# Patient Record
Sex: Female | Born: 1990 | Race: Black or African American | Hispanic: No | Marital: Single | Smoking: Never smoker
Health system: Southern US, Community
[De-identification: ages and names within clinical notes are randomized; demographics above are authoritative.]

## PROBLEM LIST (undated history)

## (undated) DIAGNOSIS — D1030 Benign neoplasm of unspecified part of mouth: Secondary | ICD-10-CM

## (undated) HISTORY — DX: Benign neoplasm of unspecified part of mouth: D10.30

---
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
## 2015-10-03 ENCOUNTER — Encounter: Payer: Self-pay | Admitting: Family Medicine

## 2015-10-03 ENCOUNTER — Ambulatory Visit (INDEPENDENT_AMBULATORY_CARE_PROVIDER_SITE_OTHER): Payer: 59 | Admitting: Family Medicine

## 2015-10-03 VITALS — BP 98/74 | HR 99 | Temp 98.8°F | Resp 15 | Ht 64.0 in | Wt 112.0 lb

## 2015-10-03 DIAGNOSIS — Z111 Encounter for screening for respiratory tuberculosis: Secondary | ICD-10-CM

## 2015-10-03 DIAGNOSIS — Z23 Encounter for immunization: Secondary | ICD-10-CM

## 2015-10-03 NOTE — Progress Notes (Signed)
Name: Shelly Hansen   MRN: [id number]    DOB: 27-Jan-1991   Date:10/03/2015       Progress Note  Subjective  Chief Complaint  Chief Complaint  Patient presents with  . Establish Care    NP    HPI  Pt. Is here to establish care. She has no previous PCP. Last Physical over 10 years ago. She is here requesting a TB test, required for her new job.  Past Medical History  Diagnosis Date  . Benign tumor of oral mucosa     Surgery for excision in 2003, 2009, and 2012    Past Surgical History  Procedure Laterality Date  . Mandible reconstruction Right 2012    Family History  Problem Relation Age of Onset  . Diabetes Father   . Hypertension Mother   . Healthy Brother     Social History   Social History  . Marital Status: Single    Spouse Name: N/A  . Number of Children: N/A  . Years of Education: N/A   Occupational History  . Not on file.   Social History Main Topics  . Smoking status: Never Smoker   . Smokeless tobacco: Never Used  . Alcohol Use: No  . Drug Use: No  . Sexual Activity: No   Other Topics Concern  . Not on file   Social History Narrative  . No narrative on file    No current outpatient prescriptions on file.  No Known Allergies   Review of Systems  Constitutional: Negative for fever, chills and weight loss.  Respiratory: Negative for cough, hemoptysis, shortness of breath and wheezing.   Cardiovascular: Negative for chest pain.    Objective  Filed Vitals:   10/03/15 1217  BP: 98/74  Pulse: 99  Temp: 98.8 F (37.1 C)  TempSrc: Oral  Resp: 15  Height: 5\' 4"  (1.626 m)  Weight: 112 lb (50.803 kg)  SpO2: 97%    Physical Exam  Constitutional: She is well-developed, well-nourished, and in no distress.  Cardiovascular: Normal rate, regular rhythm and normal heart sounds.   Pulmonary/Chest: Effort normal and breath sounds normal.  Nursing note and vitals reviewed.   Assessment & Plan  1. Need for immunization against  influenza  - Flu Vaccine QUAD 36+ mos PF IM (Fluarix & Fluzone Quad PF)  2. Encounter for PPD test  - PPD   Mohsen Odenthal Asad A. [facility] 10/03/2015 12:51 PM

## 2015-10-05 LAB — TB SKIN TEST
Induration: 0.2 mm
TB Skin Test: NEGATIVE

## 2015-10-23 ENCOUNTER — Ambulatory Visit: Payer: 59 | Admitting: Family Medicine

## 2015-11-02 ENCOUNTER — Encounter: Payer: 59 | Admitting: Family Medicine

## 2015-12-14 ENCOUNTER — Ambulatory Visit (INDEPENDENT_AMBULATORY_CARE_PROVIDER_SITE_OTHER): Payer: 59 | Admitting: Family Medicine

## 2015-12-14 ENCOUNTER — Encounter: Payer: Self-pay | Admitting: Family Medicine

## 2015-12-14 VITALS — BP 100/71 | HR 110 | Temp 98.8°F | Resp 19 | Ht 64.0 in | Wt 112.2 lb

## 2015-12-14 DIAGNOSIS — J309 Allergic rhinitis, unspecified: Secondary | ICD-10-CM | POA: Insufficient documentation

## 2015-12-14 DIAGNOSIS — J302 Other seasonal allergic rhinitis: Secondary | ICD-10-CM

## 2015-12-14 DIAGNOSIS — Z0000 Encounter for general adult medical examination without abnormal findings: Secondary | ICD-10-CM | POA: Diagnosis not present

## 2015-12-14 MED ORDER — FLUTICASONE PROPIONATE 50 MCG/ACT NA SUSP: 2.0000 | Freq: Every day | NASAL | Status: DC

## 2015-12-14 NOTE — Progress Notes (Signed)
Name: Shelly Hansen   MRN: [id number]    DOB: [birth year]   Date:12/14/2015       Progress Note  Subjective  Chief Complaint  Chief Complaint  Patient presents with  . Annual Exam    CPE w/pap    HPI  Pt. Is here for a Complete Physical Exam. She has never had a Pap Smear.    Past Medical History  Diagnosis Date  . Benign tumor of oral mucosa     Surgery for excision in 2003, 2009, and 2012    Past Surgical History  Procedure Laterality Date  . Mandible reconstruction Right 2012    Family History  Problem Relation Age of Onset  . Diabetes Father   . Hypertension Mother   . Healthy Brother     Social History   Social History  . Marital Status: Single    Spouse Name: N/A  . Number of Children: N/A  . Years of Education: N/A   Occupational History  . Not on file.   Social History Main Topics  . Smoking status: Never Smoker   . Smokeless tobacco: Never Used  . Alcohol Use: No  . Drug Use: No  . Sexual Activity: No   Other Topics Concern  . Not on file   Social History Narrative    No current outpatient prescriptions on file.  No Known Allergies   Review of Systems  Constitutional: Negative for chills, weight loss and malaise/fatigue. Fever: Had fever with stomach flu 2 weeks ago.  HENT: Negative for congestion and sore throat.   Eyes: Negative for blurred vision and double vision.  Respiratory: Negative for cough and shortness of breath.   Cardiovascular: Negative for chest pain.  Gastrointestinal: Negative for nausea, vomiting, abdominal pain, diarrhea, constipation and blood in stool.  Genitourinary: Negative for dysuria, frequency and hematuria.  Musculoskeletal: Negative for back pain and joint pain.  Skin: Negative for rash.  Neurological: Negative for dizziness and headaches.  Psychiatric/Behavioral: Negative for depression. The patient is not nervous/anxious and does not have insomnia.     Objective  Filed Vitals:   12/14/15 0900  BP:  100/71  Pulse: 110  Temp: 98.8 F (37.1 C)  TempSrc: Oral  Resp: 19  Height: 5\' 4"  (1.626 m)  Weight: 112 lb 3.2 oz (50.894 kg)  SpO2: 98%    Physical Exam  Constitutional: She is oriented to person, place, and time and well-developed, well-nourished, and in no distress.  HENT:  Head: Normocephalic and atraumatic.  Right Ear: Tympanic membrane and ear canal normal.  Left Ear: Tympanic membrane and ear canal normal.  Mouth/Throat: Mucous membranes are normal. No posterior oropharyngeal erythema.  Nasal turbinate hypertrophy, erythematous nasal mucosa.  Eyes: Conjunctivae are normal. Pupils are equal, round, and reactive to light.  Neck: Normal range of motion. Neck supple. No thyromegaly present.  Cardiovascular: Normal rate and regular rhythm.   Pulmonary/Chest: Effort normal and breath sounds normal.  Abdominal: Soft. Bowel sounds are normal.  Genitourinary:  Deferred  Musculoskeletal: Normal range of motion. She exhibits no edema.  Neurological: She is alert and oriented to person, place, and time.  Skin: Skin is warm and dry.  Psychiatric: Mood, memory, affect and judgment normal.  Nursing note and vitals reviewed.     Assessment & Plan  1. Well woman exam without gynecological exam [facility]'ll [facility] for [facility]ological exam. - CBC with Differential - Comprehensive Metabolic Panel (CMET) - TSH - Vitamin D (25 hydroxy) - Ambulatory referral to  Gynecology - Lipid Profile  2. Other seasonal allergic rhinitis Started on Flonase for relief of allergic rhinitis. - fluticasone (FLONASE) 50 MCG/ACT nasal spray; Place 2 sprays into both nostrils daily.  Dispense: 16 g; Refill: 0   Shelly Hansen Asad A. [facility] 12/14/2015 9:23 AM

## 2016-01-28 ENCOUNTER — Ambulatory Visit: Payer: 59 | Admitting: Family Medicine

## 2016-02-21 ENCOUNTER — Encounter: Payer: 59 | Admitting: Obstetrics and Gynecology

## 2017-12-30 ENCOUNTER — Encounter: Payer: Self-pay | Admitting: Family Medicine

## 2017-12-30 ENCOUNTER — Ambulatory Visit: Payer: 59 | Admitting: Family Medicine

## 2017-12-30 VITALS — BP 122/70 | HR 85 | Temp 98.8°F | Resp 20 | Ht 64.0 in | Wt 129.6 lb

## 2017-12-30 DIAGNOSIS — R0981 Nasal congestion: Secondary | ICD-10-CM | POA: Diagnosis not present

## 2017-12-30 DIAGNOSIS — J209 Acute bronchitis, unspecified: Secondary | ICD-10-CM | POA: Diagnosis not present

## 2017-12-30 MED ORDER — PROMETHAZINE-DM 6.25-15 MG/5ML PO SYRP: 5.0000 mL | ORAL_SOLUTION | Freq: Every evening | ORAL | 0 refills | Status: DC | PRN

## 2017-12-30 MED ORDER — BUDESONIDE-FORMOTEROL FUMARATE 80-4.5 MCG/ACT IN AERO: 2.0000 | INHALATION_SPRAY | Freq: Two times a day (BID) | RESPIRATORY_TRACT | 3 refills | Status: DC

## 2017-12-30 MED ORDER — AZITHROMYCIN 250 MG PO TABS: ORAL_TABLET | ORAL | 0 refills | Status: DC

## 2017-12-30 MED ORDER — FLUTICASONE PROPIONATE 50 MCG/ACT NA SUSP: 2.0000 | Freq: Every day | NASAL | 6 refills | Status: AC

## 2017-12-30 MED ORDER — BENZONATATE 100 MG PO CAPS: 100.0000 mg | ORAL_CAPSULE | Freq: Two times a day (BID) | ORAL | 0 refills | Status: DC | PRN

## 2017-12-30 NOTE — Progress Notes (Signed)
Name: Shelly Hansen   MRN: [id number]    DOB: Jun 06, 1991   Date:12/30/2017       Progress Note  Subjective  Chief Complaint  Chief Complaint  Patient presents with  . Cough    for 1 week    HPI  PT presents with 6 day history of cough that has progressively worsened since its start. Cough is non-productive, fairly constant, keeps her up at night, she notes 1-2 episodes of bronchospasm.  Endorses rhinorrhea, hoarse voice, some diarrhea.  Denies fevers/chills, body aches, shortness of breath, chest pain, N/V. 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  Patient Active Problem List   Diagnosis Date Noted  . Well woman exam without gynecological exam 12/14/2015  . Allergic rhinitis 12/14/2015    Social History   Tobacco Use  . Smoking status: Never Smoker  . Smokeless tobacco: Never Used  Substance Use Topics  . Alcohol use: No    Alcohol/week: 0.0 oz    Current Outpatient Medications:  .  azithromycin (ZITHROMAX) 250 MG tablet, Day1: Take 2 tabs; Days2-5: Take 1tab daily, Disp: 6 tablet, Rfl: 0 .  benzonatate (TESSALON) 100 MG capsule, Take 1-2 capsules (100-200 mg total) by mouth 2 (two) times daily as needed for cough., Disp: 30 capsule, Rfl: 0 .  budesonide-formoterol (SYMBICORT) 80-4.5 MCG/ACT inhaler, Inhale 2 puffs into the lungs 2 (two) times daily., Disp: 1 Inhaler, Rfl: 3 .  fluticasone (FLONASE) 50 MCG/ACT nasal spray, Place 2 sprays into both nostrils daily., Disp: 16 g, Rfl: 6 .  promethazine-dextromethorphan (PROMETHAZINE-DM) 6.25-15 MG/5ML syrup, Take 5 mLs by mouth at bedtime as needed for cough., Disp: 118 mL, Rfl: 0  No Known Allergies  ROS Ten systems reviewed and is negative except as mentioned in HPI   Objective  Vitals:   12/30/17 1106 12/30/17 1200  BP: 122/70   Pulse: (!) 111 85  Resp: 20   Temp: 98.8 F (37.1 C)   TempSrc: Oral   SpO2: 97%   Weight: 129 lb 9.6 oz (58.8 kg)   Height: 5\' 4"   (1.626 m)    Body mass index is 22.25 kg/m.  Nursing Note and Vital Signs reviewed.  Physical Exam  Constitutional: Patient appears well-developed and well-nourished. Obese. No distress.  HEENT: head atraumatic, normocephalic, pupils equal and reactive to light, EOM's intact, TM's without erythema or bulging, no maxillary or frontal sinus tenderness, neck supple without lymphadenopathy, oropharynx pink and moist without exudate. Dry cough is present during examination and worsens with deep inspiration. Cardiovascular: Normal rate, regular rhythm, S1/S2 present.  No murmur or rub heard. No BLE edema. Pulmonary/Chest: Effort normal and breath sounds clear, slightly diminished in RUL and LUL. No respiratory distress or retractions. Psychiatric: Patient has a normal mood and affect. behavior is normal. Judgment and thought content normal.  No results found for this or any previous visit (from the past 72 hour(s)).  Assessment & Plan  1. Acute bronchitis, unspecified organism - benzonatate (TESSALON) 100 MG capsule; Take 1-2 capsules (100-200 mg total) by mouth 2 (two) times daily as needed for cough.  Dispense: 30 capsule; Refill: 0 - promethazine-dextromethorphan (PROMETHAZINE-DM) 6.25-15 MG/5ML syrup; Take 5 mLs by mouth at bedtime as needed for cough.  Dispense: 118 mL; Refill: 0 - budesonide-formoterol (SYMBICORT) 80-4.5 MCG/ACT inhaler; Inhale 2 puffs into the lungs 2 (two) times daily.  Dispense: 1 Inhaler; Refill: 3 - azithromycin (ZITHROMAX) 250 MG tablet; Day1: Take 2 tabs;  Days2-5: Take 1tab daily  Dispense: 6 tablet; Refill: 0  2. Nasal congestion - fluticasone (FLONASE) 50 MCG/ACT nasal spray; Place 2 sprays into both nostrils daily.  Dispense: 16 g; Refill: 6  -Red flags and when to present for emergency care or RTC including fever >101.94F, chest pain, shortness of breath, new/worsening/un-resolving symptoms, reviewed with patient at time of visit. Follow up and care instructions  discussed and provided in AVS.

## 2017-12-30 NOTE — Patient Instructions (Addendum)
Cool Mist Vaporizer A cool mist vaporizer is a device that releases a cool mist into the air. If you have a cough or a cold, using a vaporizer may help relieve your symptoms. The mist adds moisture to the air, which may help thin your mucus and make it less sticky. When your mucus is thin and less sticky, it easier for you to breathe and to cough up secretions. Do not use a vaporizer if you are allergic to mold. Follow these instructions at home:  Follow the instructions that come with the vaporizer.  Do not use anything other than distilled water in the vaporizer.  Do not run the vaporizer all of the time. Doing that can cause mold or bacteria to grow in the vaporizer.  Clean the vaporizer after each time that you use it.  Clean and dry the vaporizer well before storing it.  Stop using the vaporizer if your breathing symptoms get worse. This information is not intended to replace advice given to you by your health care provider. Make sure you discuss any questions you have with your health care provider. Document Released: 07/03/2004 Document Revised: 04/25/2016 Document Reviewed: 01/05/2016 Elsevier Interactive Patient Education  2018 [employer]. Acute Bronchitis, Adult Acute bronchitis is when air tubes (bronchi) in the lungs suddenly get swollen. The condition can make it hard to breathe. It can also cause these symptoms:  A cough.  Coughing up clear, yellow, or green mucus.  Wheezing.  Chest congestion.  Shortness of breath.  A fever.  Body aches.  Chills.  A sore throat.  Follow these instructions at home: Medicines  Take over-the-counter and prescription medicines only as told by your doctor.  If you were prescribed an antibiotic medicine, take it as told by your doctor. Do not stop taking the antibiotic even if you start to feel better. General instructions  Rest.  Drink enough fluids to keep your pee (urine) clear or pale yellow.  Avoid smoking and  secondhand smoke. If you smoke and you need help quitting, ask your doctor. Quitting will help your lungs heal faster.  Use an inhaler, cool mist vaporizer, or humidifier as told by your doctor.  Keep all follow-up visits as told by your doctor. This is important. How is this prevented? To lower your risk of getting this condition again:  Wash your hands often with soap and water. If you cannot use soap and water, use hand sanitizer.  Avoid contact with people who have cold symptoms.  Try not to touch your hands to your mouth, nose, or eyes.  Make sure to get the flu shot every year.  Contact a doctor if:  Your symptoms do not get better in 2 weeks. Get help right away if:  You cough up blood.  You have chest pain.  You have very bad shortness of breath.  You become dehydrated.  You faint (pass out) or keep feeling like you are going to pass out.  You keep throwing up (vomiting).  You have a very bad headache.  Your fever or chills gets worse. This information is not intended to replace advice given to you by your health care provider. Make sure you discuss any questions you have with your health care provider. Document Released: 03/24/2008 Document Revised: 05/14/2016 Document Reviewed: 03/26/2016 Elsevier Interactive Patient Education  [employer].

## 2018-04-12 DIAGNOSIS — H66001 Acute suppurative otitis media without spontaneous rupture of ear drum, right ear: Secondary | ICD-10-CM | POA: Diagnosis not present

## 2018-04-12 DIAGNOSIS — J069 Acute upper respiratory infection, unspecified: Secondary | ICD-10-CM | POA: Diagnosis not present

## 2018-04-28 ENCOUNTER — Encounter: Payer: Self-pay | Admitting: Nurse Practitioner

## 2018-04-28 ENCOUNTER — Encounter: Payer: Self-pay | Admitting: Family Medicine

## 2018-04-28 ENCOUNTER — Ambulatory Visit (INDEPENDENT_AMBULATORY_CARE_PROVIDER_SITE_OTHER): Payer: 59 | Admitting: Nurse Practitioner

## 2018-04-28 VITALS — BP 120/70 | HR 99 | Temp 98.6°F | Resp 12 | Ht 63.39 in | Wt 124.0 lb

## 2018-04-28 DIAGNOSIS — Z23 Encounter for immunization: Secondary | ICD-10-CM

## 2018-04-28 DIAGNOSIS — Z0000 Encounter for general adult medical examination without abnormal findings: Secondary | ICD-10-CM | POA: Diagnosis not present

## 2018-04-28 DIAGNOSIS — Z124 Encounter for screening for malignant neoplasm of cervix: Secondary | ICD-10-CM | POA: Diagnosis not present

## 2018-04-28 DIAGNOSIS — Z111 Encounter for screening for respiratory tuberculosis: Secondary | ICD-10-CM

## 2018-04-28 DIAGNOSIS — N6320 Unspecified lump in the left breast, unspecified quadrant: Secondary | ICD-10-CM

## 2018-04-28 DIAGNOSIS — Z5320 Procedure and treatment not carried out because of patient's decision for unspecified reasons: Secondary | ICD-10-CM | POA: Insufficient documentation

## 2018-04-28 NOTE — Patient Instructions (Addendum)
General Recommendations: 150 minutes of physical activity weekly, eat two servings of fish weekly, eat one serving of tree nuts ( cashews, pistachios, pecans, almonds.Marland Kitchen) every other day, eat 6 servings of fruit/vegetables daily and drink plenty of water and avoid sweet beverages. Goal to drink at least 64 ounces of water a day.   Eating a diet that is high in fiber has many potential health benefits, including a decreased risk of heart disease, stroke, and type 2 diabetes and to regulate digestive health. Because high-fiber foods may be healthy for reasons other than their fiber content, the research has not always been able to determine if fiber is the healthful component.  Fiber is normally found in beans, grains, vegetables, and fruits. Dietary sources of fiber - The fiber content of many foods, including fruits and vegetables. Breakfast cereals can be a good source of fiber. Some fruits and vegetables are particularly helpful in treating constipation, such as prunes and prune juice. Other sources of fiber - For those who do not like high-fiber foods such as fruits, beans, and vegetables, a good source of fiber is unprocessed wheat bran; one to two tablespoons can be mixed with food. One tablespoon of wheat bran contains approximately 1.6 grams of fiber.

## 2018-04-28 NOTE — Progress Notes (Addendum)
Name: Shelly Hansen   MRN: [id number]    DOB: [birth year]   Date:04/28/2018       Progress Note  Subjective  Chief Complaint  Chief Complaint  Patient presents with  . Annual Exam  . PPD Placement    HPI   Patient presents for annual CPE . Pt was treated for URI and double ear infection started taking amoxicillin stopped 5 pills in and yesterday started having sore throat and restarted medications.   Diet: varies; one big meal and snacking through the day or 2 meals with snacks. Eats out a lot- lately since she's been on vacation; lots of chicfila; has few servings of vegetables a week. Eats fruits daily. 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  USPSTF grade A and B recommendations  Depression:  Depression screen [facility] 2/9 04/28/2018 12/30/2017 12/14/2015 10/03/2015  Decreased Interest 0 0 0 0  Down, Depressed, Hopeless 0 0 0 0  PHQ - 2 Score 0 0 0 0   Hypertension: BP Readings from Last 3 Encounters:  04/28/18 120/70  12/30/17 122/70  12/14/15 100/71   Obesity: Wt Readings from Last 3 Encounters:  04/28/18 124 lb (56.2 kg)  12/30/17 129 lb 9.6 oz (58.8 kg)  12/14/15 112 lb 3.2 oz (50.9 kg)   BMI Readings from Last 3 Encounters:  04/28/18 21.70 kg/m  12/30/17 22.25 kg/m  12/14/15 19.26 kg/m    Alcohol: rarely; one drink if she's drinking Tobacco use: denies HIV, hep B, hep C: declines STD testing and prevention (chl/gon/syphilis): declines Intimate partner violence: declines  Sexual History/Pain during Intercourse: virgin  Menstrual History/LMP/Abnormal Bleeding: regular periods; normal mild cramping.   Advanced Care Planning: A voluntary discussion about advance care planning including the explanation and discussion of advance directives.  Discussed health care proxy and Living will, and the patient was able to identify a health care proxy as yvana samonte  [phone]  Patient does not have a living will at present time. If patient does have living will, I have requested they bring this to the clinic to be scanned in to their chart.  Cervical cancer screening: declines pap smear; states is not sexually active    Lipids:  No results found for: CHOL No results found for: HDL No results found for: LDLCALC No results found for: TRIG No results found for: CHOLHDL No results found for: LDLDIRECT  Glucose:  No results found for: GLUCOSE, GLUCAP  Skin cancer: states tries to avoid the sun    Patient Active Problem List   Diagnosis Date Noted  . Well woman exam without gynecological exam 12/14/2015  . Allergic rhinitis 12/14/2015    Past Surgical History:  Procedure Laterality Date  . MANDIBLE RECONSTRUCTION Right 2012    Family History  Problem Relation Age of Onset  . Diabetes Father   . Hypertension Mother   . Healthy Brother   . Dementia Maternal Grandmother   . COPD Maternal Grandfather   . Diabetes Paternal Grandfather     Social History   Socioeconomic History  . Marital status: Single    Spouse name: Not on file  . Number of children: Not on file  . Years of education: Not on file  . Highest education level: Associate degree: academic program  Occupational History  . Occupation: Corporate treasurer  Social Needs  . Financial resource strain: Not hard at all  . Food insecurity:  Worry: Never true    Inability: Never true  . Transportation needs:    Medical: No    Non-medical: Yes  Tobacco Use  . Smoking status: Never Smoker  . Smokeless tobacco: Never Used  Substance and Sexual Activity  . Alcohol use: No    Alcohol/week: 0.0 oz  . Drug use: No  . Sexual activity: Never  Lifestyle  . Physical activity:    Days per week: 0 days    Minutes per session: 0 min  . Stress: Not at all  Relationships  . Social connections:    Talks on phone: Twice a week    Gets together: More than three times a week     Attends religious service: More than 4 times per year    Active member of club or organization: No    Attends meetings of clubs or organizations: Not on file    Relationship status: Never married  . Intimate partner violence:    Fear of current or ex partner: No    Emotionally abused: No    Physically abused: No    Forced sexual activity: No  Other Topics Concern  . 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  fluticasone (FLONASE) 50 MCG/ACT nasal spray, Place 2 sprays into both nostrils daily., Disp: 16 g, Rfl: 6  No Known Allergies   ROS  Constitutional: Negative for fever or weight change.  Respiratory: Negative for cough and shortness of breath.   Cardiovascular: Negative for chest pain or palpitations.  Gastrointestinal: Negative for abdominal pain, no bowel changes.  Musculoskeletal: Negative for gait problem or joint swelling.  Skin: Negative for rash.  Neurological: Negative for dizziness or headache.  No other specific complaints in a complete review of systems (except as listed in HPI above).   Objective  Vitals:   04/28/18 1436  BP: 120/70  Pulse: 99  Resp: 12  Temp: 98.6 F (37 C)  TempSrc: Oral  SpO2: 95%  Weight: 124 lb (56.2 kg)  Height: 5' 3.39" (1.61 m)    Body mass index is 21.7 kg/m.  Physical Exam  Constitutional: Patient appears well-developed and well-nourished. No distress.  HENT: Head: Normocephalic and atraumatic. Ears: B TMs ok, no erythema or effusion; Nose: Nose normal. Mouth/Throat: Oropharynx is clear and moist. No oropharyngeal exudate.  Eyes: Conjunctivae and EOM are normal. Pupils are equal, round, and reactive to light. No scleral icterus.  Neck: Normal range of motion. Neck supple. No JVD present. No thyromegaly present.   Cardiovascular: Normal rate, regular rhythm and normal heart sounds.  No murmur heard. No BLE edema. Pulmonary/Chest: Effort normal and breath sounds normal. No respiratory distress. Abdominal: Soft. Bowel sounds are normal, no distension. There is no tenderness. no masses Breast: Small circular, hard, 1-2cm mass palpated above left nipple, no lumps or masses in right breast no nipple discharge or rashes Musculoskeletal: Normal range of motion, no joint effusions. No gross deformities Neurological: he is alert and oriented to person, place, and time. No cranial nerve deficit. Coordination, balance, strength, speech and gait are normal.  Skin: Skin is warm and dry. No rash noted. No erythema.  Psychiatric: Patient has a normal mood and affect. behavior is normal. Judgment and thought content normal.   No results  found for this or any previous visit (from the past 2160 hour(s)).    Fall Risk: Fall Risk  04/28/2018 12/30/2017 12/14/2015 10/03/2015  Falls in the past year? No No No No     Functional Status Survey: Is the patient deaf or have difficulty hearing?: No Does the patient have difficulty seeing, even when wearing glasses/contacts?: No Does the patient have difficulty concentrating, remembering, or making decisions?: No Does the patient have difficulty walking or climbing stairs?: No Does the patient have difficulty dressing or bathing?: No Does the patient have difficulty doing errands alone such as visiting a doctor's office or shopping?: No   Assessment & Plan  1. Routine general medical examination at a health care facility Has BM 2-3 times a week; discussed high-fiber diet, increase vegetable intake and goal of 64 ounces of water a day - CBC with Differential - COMPLETE METABOLIC PANEL WITH GFR  2. Screening for cervical cancer Pt. Politely declined; she is a virgin, no family history of cancer, does not smoke low risk for cervical cancer but discussed that she still has  potential risk; available to get pap smear here or refer to GYN if she changes her mind   3. Need for Tdap vaccination - Tdap vaccine greater than or equal to 7yo IM  4. Screening for tuberculosis Needed for school  - QuantiFERON-TB Gold Plus  5. Need for HPV vaccination - HPV 9-valent vaccine,Recombinat  6. Breast mass, left - US BREAST LTD UNI LEFT INC AXILLA; Future - MM Digital Diagnostic Bilat; Future   -USPSTF grade A and B recommendations reviewed with patient; age-appropriate recommendations, preventive care, screening tests, etc discussed and encouraged; healthy living encouraged; see AVS for patient education given to patient -Discussed importance of 150 minutes of physical activity weekly, eat two servings of fish weekly, eat one serving of tree nuts ( cashews, pistachios, pecans, almonds.Marland Kitchen) every other day, eat 6 servings of fruit/vegetables daily and drink plenty of water and avoid sweet beverages.  -Red flags and when to present for emergency care or RTC including fever >101.7F, chest pain, shortness of breath, new/worsening/un-resolving symptoms, reviewed with patient at time of visit. Follow up and care instructions discussed and provided in AVS.  -------------------------------------------- I have reviewed this encounter including the documentation in this note and/or discussed this patient with the provider, Suezanne Cheshire DNP AGNP-C. I am certifying that I agree with the content of this note as supervising physician. Enid Derry, [facility] 04/28/2018, 4:51 PM

## 2018-04-28 NOTE — Addendum Note (Signed)
Addended by: Fredderick Severance on: 04/28/2018 03:45 PM   Modules accepted: Orders

## 2018-04-29 ENCOUNTER — Telehealth: Payer: Self-pay | Admitting: Nurse Practitioner

## 2018-04-29 ENCOUNTER — Other Ambulatory Visit: Payer: Self-pay | Admitting: Nurse Practitioner

## 2018-04-29 DIAGNOSIS — D649 Anemia, unspecified: Secondary | ICD-10-CM

## 2018-04-29 NOTE — Telephone Encounter (Signed)
Please see if we can add on anemia panel- if not request patient to come in for additional blood work already ordered.  Please ask if she has heavy periods or noticed any other bleeding Most common kind of anemia is iron deficiency anemia; encourage increase in iron intake and drinking lots of water until her anemia panel returns.  High iron Foods include: Animal- Chicken liver,Oysters, Clams, Beef liver, Beef (chuck roast, lean ground beef), [place] leg, Tuna Eggs Shrimp Leg of lamb Plant- Raisin bran (enriched), Instant oatmeal,Beans (kidney, lima, Navy),Tofu, Lentils, Molasses, Spinach, Whole wheat bread, Peanut butter, Brown rice

## 2018-04-30 NOTE — Telephone Encounter (Signed)
Patient states that she does not have heavy periods her periods are usually light unless she skips a period or if her periods are late. I will see if Shelly Hansen can add an anemia panel to her blood.

## 2018-05-03 ENCOUNTER — Other Ambulatory Visit: Payer: Self-pay | Admitting: Nurse Practitioner

## 2018-05-03 DIAGNOSIS — D509 Iron deficiency anemia, unspecified: Secondary | ICD-10-CM

## 2018-05-03 LAB — IRON,TIBC AND FERRITIN PANEL
%SAT: 5 % (calc) — ABNORMAL LOW (ref 16–45)
Ferritin: 5 ng/mL — ABNORMAL LOW (ref 16–154)
IRON: 19 ug/dL — AB (ref 40–190)
TIBC: 406 ug/dL (ref 250–450)

## 2018-05-03 LAB — TEST AUTHORIZATION

## 2018-05-03 LAB — B12 AND FOLATE PANEL
Folate: 12.1 ng/mL
Vitamin B-12: 941 pg/mL (ref 200–1100)

## 2018-05-03 LAB — CBC WITH DIFFERENTIAL/PLATELET
BASOS PCT: 0.5 %
Basophils Absolute: 38 cells/uL (ref 0–200)
EOS PCT: 1.1 %
Eosinophils Absolute: 83 cells/uL (ref 15–500)
HEMATOCRIT: 32.8 % — AB (ref 35.0–45.0)
HEMOGLOBIN: 10.3 g/dL — AB (ref 11.7–15.5)
LYMPHS ABS: 2520 {cells}/uL (ref 850–3900)
MCH: 23.3 pg — ABNORMAL LOW (ref 27.0–33.0)
MCHC: 31.4 g/dL — ABNORMAL LOW (ref 32.0–36.0)
MCV: 74 fL — AB (ref 80.0–100.0)
MPV: 11.1 fL (ref 7.5–12.5)
Monocytes Relative: 11.8 %
NEUTROS ABS: 3975 {cells}/uL (ref 1500–7800)
Neutrophils Relative %: 53 %
Platelets: 389 10*3/uL (ref 140–400)
RBC: 4.43 10*6/uL (ref 3.80–5.10)
RDW: 16.1 % — ABNORMAL HIGH (ref 11.0–15.0)
Total Lymphocyte: 33.6 %
WBC: 7.5 10*3/uL (ref 3.8–10.8)
WBCMIX: 885 {cells}/uL (ref 200–950)

## 2018-05-03 LAB — QUANTIFERON-TB GOLD PLUS
Mitogen-NIL: >10 IU/mL
NIL: 0.02 IU/mL
QUANTIFERON-TB GOLD PLUS: NEGATIVE
TB1-NIL: 0 IU/mL
TB2-NIL: 0 IU/mL

## 2018-05-03 LAB — COMPLETE METABOLIC PANEL WITH GFR
AG Ratio: 1.5 (calc) (ref 1.0–2.5)
ALBUMIN MSPROF: 4.3 g/dL (ref 3.6–5.1)
ALKALINE PHOSPHATASE (APISO): 43 U/L (ref 33–115)
ALT: 9 U/L (ref 6–29)
AST: 16 U/L (ref 10–30)
BUN: 10 mg/dL (ref 7–25)
CO2: 26 mmol/L (ref 20–32)
CREATININE: 0.71 mg/dL (ref 0.50–1.10)
Calcium: 9 mg/dL (ref 8.6–10.2)
Chloride: 104 mmol/L (ref 98–110)
GFR, Est African American: 137 mL/min/{1.73_m2} (ref >=60)
GFR, Est Non African American: 118 mL/min/{1.73_m2} (ref >=60)
Globulin: 2.8 g/dL (calc) (ref 1.9–3.7)
Glucose, Bld: 107 mg/dL (ref 65–139)
Potassium: 3.8 mmol/L (ref 3.5–5.3)
SODIUM: 138 mmol/L (ref 135–146)
Total Bilirubin: 0.4 mg/dL (ref 0.2–1.2)
Total Protein: 7.1 g/dL (ref 6.1–8.1)

## 2018-05-03 MED ORDER — FERROUS SULFATE 325 (65 FE) MG PO TABS: 325.0000 mg | ORAL_TABLET | Freq: Every day | ORAL | 0 refills | Status: DC

## 2018-05-17 ENCOUNTER — Ambulatory Visit
Admission: RE | Admit: 2018-05-17 | Discharge: 2018-05-17 | Disposition: A | Discharge status: Home / Self Care | Payer: 59 | Source: Ambulatory Visit | Attending: Nurse Practitioner | Admitting: Nurse Practitioner

## 2018-05-17 DIAGNOSIS — N6489 Other specified disorders of breast: Secondary | ICD-10-CM | POA: Diagnosis not present

## 2018-05-17 DIAGNOSIS — N6320 Unspecified lump in the left breast, unspecified quadrant: Secondary | ICD-10-CM | POA: Insufficient documentation

## 2018-07-02 ENCOUNTER — Encounter: Payer: Self-pay | Admitting: Nurse Practitioner

## 2018-07-02 ENCOUNTER — Ambulatory Visit (INDEPENDENT_AMBULATORY_CARE_PROVIDER_SITE_OTHER): Payer: 59 | Admitting: Nurse Practitioner

## 2018-07-02 VITALS — BP 118/64 | HR 81 | Temp 98.6°F | Ht 63.0 in | Wt 124.8 lb

## 2018-07-02 DIAGNOSIS — Z23 Encounter for immunization: Secondary | ICD-10-CM | POA: Diagnosis not present

## 2018-07-02 NOTE — Progress Notes (Signed)
Patient had questions about tampon use. Discussed. Follow-up for pap smear made.

## 2018-07-16 ENCOUNTER — Ambulatory Visit: Payer: 59 | Admitting: Nurse Practitioner

## 2019-05-03 ENCOUNTER — Encounter: Payer: 59 | Admitting: Family Medicine

## 2019-10-18 ENCOUNTER — Ambulatory Visit: Payer: 59 | Attending: Internal Medicine

## 2019-10-20 ENCOUNTER — Ambulatory Visit: Payer: 59 | Attending: Internal Medicine

## 2019-10-20 DIAGNOSIS — Z20822 Contact with and (suspected) exposure to covid-19: Secondary | ICD-10-CM

## 2019-10-26 LAB — NOVEL CORONAVIRUS, NAA

## 2020-01-13 ENCOUNTER — Ambulatory Visit: Payer: Self-pay | Attending: Internal Medicine

## 2020-01-13 DIAGNOSIS — Z23 Encounter for immunization: Secondary | ICD-10-CM

## 2020-01-13 NOTE — Progress Notes (Signed)
   Covid-19 Vaccination Clinic  Name:  [name]    MRN: [id number] DOB: 12-16-1990  01/13/2020  [name] was observed post Covid-19 immunization for 15 minutes without incident. She was provided with Vaccine Information Sheet and instruction to access the V-Safe system.   [name] was instructed to call 911 with any severe reactions post vaccine: Marland Kitchen Difficulty breathing  . Swelling of face and throat  . A fast heartbeat  . A bad rash all over body  . Dizziness and weakness   Immunizations Administered    Name Date Dose VIS Date Route   Pfizer COVID-19 Vaccine 01/13/2020 12:22 PM 0.3 mL 09/30/2019 Intramuscular   Manufacturer: [place]   Lot: [id number]   [place]: [id number]

## 2020-02-08 ENCOUNTER — Ambulatory Visit: Payer: Self-pay | Attending: Internal Medicine

## 2020-02-08 DIAGNOSIS — Z23 Encounter for immunization: Secondary | ICD-10-CM

## 2020-02-08 NOTE — Progress Notes (Signed)
   Covid-19 Vaccination Clinic  Name:  Tiawana Clouse    MRN: [id number] DOB: Sep 17, 1991  02/08/2020  Ms. Giammarino was observed post Covid-19 immunization for 15 minutes without incident. She was provided with Vaccine Information Sheet and instruction to access the V-Safe system.   Ms. Dolezal was instructed to call 911 with any severe reactions post vaccine: Marland Kitchen Difficulty breathing  . Swelling of face and throat  . A fast heartbeat  . A bad rash all over body  . Dizziness and weakness   Immunizations Administered    Name Date Dose VIS Date Route   Pfizer COVID-19 Vaccine 02/08/2020 12:00 PM 0.3 mL 12/14/2018 Intramuscular   Manufacturer: [place]   Lot: [id number]   [place]: [id number]

## 2020-09-05 ENCOUNTER — Ambulatory Visit: Payer: Self-pay | Attending: Internal Medicine

## 2020-09-05 ENCOUNTER — Other Ambulatory Visit: Payer: Self-pay

## 2020-09-05 ENCOUNTER — Other Ambulatory Visit (HOSPITAL_COMMUNITY): Payer: Self-pay | Admitting: Internal Medicine

## 2020-09-05 DIAGNOSIS — Z23 Encounter for immunization: Secondary | ICD-10-CM

## 2020-09-05 NOTE — Progress Notes (Signed)
   Covid-19 Vaccination Clinic  Name:  Shelly Hansen    MRN: [id number] DOB: December 28, 1990  09/05/2020  [name] was observed post Covid-19 immunization for 15 minutes without incident. She was provided with Vaccine Information Sheet and instruction to access the V-Safe system.   [name] was instructed to call 911 with any severe reactions post vaccine: Marland Kitchen Difficulty breathing  . Swelling of face and throat  . A fast heartbeat  . A bad rash all over body  . Dizziness and weakness   Immunizations Administered    Name Date Dose VIS Date Route   Pfizer COVID-19 Vaccine 09/05/2020  2:04 PM 0.3 mL 08/08/2020 Intramuscular   Manufacturer: [place]   Lot: [id number]   [place]: [address]-4

## 2020-11-12 ENCOUNTER — Other Ambulatory Visit: Payer: Self-pay

## 2020-11-16 ENCOUNTER — Telehealth: Payer: Self-pay | Admitting: Nurse Practitioner

## 2020-11-16 DIAGNOSIS — R1013 Epigastric pain: Secondary | ICD-10-CM

## 2020-11-16 NOTE — Progress Notes (Signed)
Based on what you shared with me it looks like you have abdominal pain,that should be evaluated in a face to face office visit. You need further testing to determine proper treatment.    NOTE: If you entered your credit card information for this eVisit, you will not be charged. You may see a "hold" on your card for the $35 but that hold will drop off and you will not have a charge processed.  If you are having a true medical emergency please call 911.     For an urgent face to face visit, [facility] has four urgent care centers for your convenience:   . [facility]    [phone]                  Get Driving Directions  [address], [place] [address] . 10 am to 8 pm Monday-Friday . 12 pm to 8 pm Saturday-Sunday   . [facility] [place]                  Get Driving Directions  [address], [place], [place] [address] . 8 am to 8 pm Monday-Friday . 9 am to 6 pm Saturday . 11 am to 6 pm Sunday   . [facility] at [place]                  Get Driving Directions   [address] [place], [place] [address] . 8 am to 8 pm Monday-Friday . 8 am to 4 pm Saturday-Sunday    . [facility] at [place]                    Get Driving Directions  [phone]  [address], [place], [place] [address]  . Monday-Friday, 12 PM to 6 PM    Your e-visit answers were reviewed by a board certified advanced clinical practitioner to complete your personal care plan.  Thank you for using e-Visits.

## 2020-12-17 ENCOUNTER — Ambulatory Visit (INDEPENDENT_AMBULATORY_CARE_PROVIDER_SITE_OTHER): Payer: 59 | Admitting: Family Medicine

## 2020-12-17 ENCOUNTER — Encounter: Payer: Self-pay | Admitting: Family Medicine

## 2020-12-17 ENCOUNTER — Other Ambulatory Visit: Payer: Self-pay

## 2020-12-17 VITALS — BP 106/60 | HR 81 | Temp 97.7°F | Resp 16 | Ht 63.0 in | Wt 115.6 lb

## 2020-12-17 DIAGNOSIS — Z0000 Encounter for general adult medical examination without abnormal findings: Secondary | ICD-10-CM

## 2020-12-17 DIAGNOSIS — F419 Anxiety disorder, unspecified: Secondary | ICD-10-CM | POA: Diagnosis not present

## 2020-12-17 DIAGNOSIS — D509 Iron deficiency anemia, unspecified: Secondary | ICD-10-CM

## 2020-12-17 DIAGNOSIS — Z1159 Encounter for screening for other viral diseases: Secondary | ICD-10-CM

## 2020-12-17 DIAGNOSIS — Z5320 Procedure and treatment not carried out because of patient's decision for unspecified reasons: Secondary | ICD-10-CM

## 2020-12-17 DIAGNOSIS — Z114 Encounter for screening for human immunodeficiency virus [HIV]: Secondary | ICD-10-CM

## 2020-12-17 MED ORDER — HYDROXYZINE HCL 10 MG PO TABS: 10.0000 mg | ORAL_TABLET | Freq: Every evening | ORAL | 0 refills | Status: DC | PRN

## 2020-12-17 NOTE — Assessment & Plan Note (Signed)
Rechecking labs today.

## 2020-12-17 NOTE — Assessment & Plan Note (Signed)
Recently established with counseling, wants to give more time with this and developing coping mechanisms prior to starting controller medication, believe this is reasonable. Also with difficulties sleeping with good sleep hygiene and refractory to melatonin. Will trail atarax qhs prn. F/u prn.

## 2020-12-17 NOTE — Progress Notes (Signed)
BP 106/60   Pulse 81   Temp 97.7 F (36.5 C) (Oral)   Resp 16   Ht 5\' 3"  (1.6 m)   Wt 115 lb 9.6 oz (52.4 kg)   SpO2 100%   BMI 20.48 kg/m    Subjective:    Patient ID: Shelly Hansen, female    DOB: 1991-07-15, 30 y.o.   MRN: [id number]  HPI: Shelly Hansen is a 30 y.o. female presenting on 12/17/2020 for comprehensive medical examination. Current medical complaints include:none   Anxiety/PTSD - Medications: none - Taking: n/a - Counseling: yes, just started 3 weeks ago - Previous hospitalizations: no - FH of psych illness: no - Symptoms: nausea (improved), heart racing, limb tingling. Difficulty sleeping, hard to go to sleep.  - Current stressors: job stress - good sleep hygiene - no screen time prior to bed, has regular bedtime routine, keeps bedside journal, no late caffeine use - previously tried melatonin.   She currently lives with: parents, relationships good Menopausal Symptoms: no  Depression Screen done today and results listed below:  Depression screen [facility] 2/9 12/17/2020 07/02/2018 04/28/2018 12/30/2017 12/14/2015  Decreased Interest - 0 0 0 0  Down, Depressed, Hopeless 2 0 0 0 0  PHQ - 2 Score 2 0 0 0 0  Altered sleeping 2 - - - -  Tired, decreased energy 2 - - - -  Change in appetite 1 - - - -  Feeling bad or failure about yourself  2 - - - -  Trouble concentrating 1 - - - -  Moving slowly or fidgety/restless 0 - - - -  PHQ-9 Score 10 - - - -  Difficult doing work/chores Somewhat difficult - - - -    The patient does not have a history of falls. I did not complete a risk assessment for falls. A plan of care for falls was not documented.   Past Medical History:  Past Medical History:  Diagnosis Date  . Benign tumor of oral mucosa    Surgery for excision in 2003, 2009, and 2012    Surgical History:  Past Surgical History:  Procedure Laterality Date  . MANDIBLE RECONSTRUCTION Right 2012    Medications:  Current Outpatient Medications on File Prior to  Visit  Medication Sig  . fluticasone (FLONASE) 50 MCG/ACT nasal spray Place 2 sprays into both nostrils daily.   No current facility-administered medications on file prior to visit.    Allergies:  No Known Allergies  Social History:  Social History   Socioeconomic History  . Marital status: Single    Spouse name: Not on file  . Number of children: Not on file  . Years of education: Not on file  . Highest education level: Associate degree: academic program  Occupational History  . Occupation: Corporate treasurer  Tobacco Use  . Smoking status: Never Smoker  . Smokeless tobacco: Never Used  Substance and Sexual Activity  . Alcohol use: No    Alcohol/week: 0.0 standard drinks  . Drug use: No  . Sexual activity: Never  Other Topics Concern  . 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 Difficulty of Paying Living Expenses: Somewhat hard  Food Insecurity: No Food Insecurity  . Worried About  Running Out of Food in the Last Year: Never true  . Ran Out of Food in the Last Year: Never true  Transportation Needs: No Transportation Needs  . Lack of Transportation (Medical): No  . Lack of Transportation (Non-Medical): No  Physical Activity: Insufficiently Active  . Days of Exercise per Week: 1 day  . Minutes of Exercise per Session: 20 min  Stress: Stress Concern Present  . Feeling of Stress : Very much  Social Connections: Moderately Isolated  . Frequency of Communication with Friends and Family: More than three times a week  . Frequency of Social Gatherings with Friends and Family: Not on file  . Attends Religious Services: More than 4 times per year  . Active Member of Clubs or Organizations: Not on file  . Attends Archivist Meetings:  Never  . Marital Status: Never married  Intimate Partner Violence: Not At Risk  . Fear of Current or Ex-Partner: No  . Emotionally Abused: No  . Physically Abused: No  . Sexually Abused: No   Social History   Tobacco Use  Smoking Status Never Smoker  Smokeless Tobacco Never Used   Social History   Substance and Sexual Activity  Alcohol Use No  . Alcohol/week: 0.0 standard drinks    Family History:  Family History  Problem Relation Age of Onset  . Diabetes Father   . Hypertension Mother   . Healthy Brother   . Dementia Maternal Grandmother   . COPD Maternal Grandfather   . Diabetes Paternal Grandfather     Past medical history, surgical history, medications, allergies, family history and social history reviewed with patient today and changes made to appropriate areas of the chart.   ROS All other ROS negative except what is listed above and in the HPI.      Objective:    BP 106/60   Pulse 81   Temp 97.7 F (36.5 C) (Oral)   Resp 16   Ht 5\' 3"  (1.6 m)   Wt 115 lb 9.6 oz (52.4 kg)   SpO2 100%   BMI 20.48 kg/m   Wt Readings from Last 3 Encounters:  12/17/20 115 lb 9.6 oz (52.4 kg)  07/02/18 124 lb 12.8 oz (56.6 kg)  04/28/18 124 lb (56.2 kg)    Physical Exam  Results for orders placed or performed in visit on 10/20/19  Novel Coronavirus, NAA ([facility])   Specimen: Nasopharyngeal(NP) swabs in vial transport medium   NASOPHARYNGE  TESTING  Result Value Ref Range   SARS-CoV-2, NAA CANCELED       Assessment & Plan:   Problem List Items Addressed This Visit      Other   Well woman exam without gynecological exam   Pap smear of cervix declined   Iron deficiency anemia    Rechecking labs today.      Relevant Orders   CBC   Ferritin   Anxiety    Recently established with counseling, wants to give more time with this and developing coping mechanisms prior to starting controller medication, believe this is reasonable. Also with difficulties sleeping  with good sleep hygiene and refractory to melatonin. Will trail atarax qhs prn. F/u prn.       Relevant Medications   hydrOXYzine (ATARAX/VISTARIL) 10 MG tablet    Other Visit Diagnoses    Need for hepatitis C screening test    -  Primary   Relevant Orders   Hepatitis C antibody   Screening for HIV (human immunodeficiency virus)  Relevant Orders   HIV antibody (with reflex)       Follow up plan: Return if symptoms worsen or fail to improve.   LABORATORY TESTING:  - Pap smear: due, declines for today. will make f/u appt to update.   Reproductive History - G0 - Menses: normally every month - Sexually active: no - Contraception: abstinence - Cancer screening: due   IMMUNIZATIONS:   - Tdap: Tetanus vaccination status reviewed: last tetanus booster within 10 years. - Influenza: Refused - Pneumovax: Not applicable - Prevnar: Not applicable - HPV: Up to date - Zostavax vaccine: Not applicable  SCREENING: -Mammogram: Not applicable  - Colonoscopy: Not applicable  - Bone Density: Not applicable   PATIENT COUNSELING:   Advised to take 1 mg of folate supplement per day if capable of pregnancy.   Sexuality: Discussed sexually transmitted diseases, partner selection, use of condoms, avoidance of unintended pregnancy  and contraceptive alternatives.   Advised to avoid cigarette smoking.  I discussed with the patient that most people either abstain from alcohol or drink within safe limits (<=14/week and <=4 drinks/occasion for males, <=7/weeks and <= 3 drinks/occasion for females) and that the risk for alcohol disorders and other health effects rises proportionally with the number of drinks per week and how often a drinker exceeds daily limits.  Discussed cessation/primary prevention of drug use and availability of treatment for abuse.   Diet: Encouraged to adjust caloric intake to maintain  or achieve ideal body weight, to reduce intake of dietary saturated fat and total  fat, to limit sodium intake by avoiding high sodium foods and not adding table salt, and to maintain adequate dietary potassium and calcium preferably from fresh fruits, vegetables, and low-fat dairy products.    Stressed the importance of regular exercise  Injury prevention: Discussed safety belts, safety helmets, smoke detector, smoking near bedding or upholstery.   Dental health: Discussed importance of regular tooth brushing, flossing, and dental visits.    NEXT PREVENTATIVE PHYSICAL DUE IN 1 YEAR. Return if symptoms worsen or fail to improve.

## 2020-12-17 NOTE — Patient Instructions (Signed)
It was great to see you!  Our plans for today:  - Take the hydroxyzine at night as needed for sleep and anxiety. Continue to keep a regular bedtime routine and good sleep hygiene (see below for tips). - Continue to see your counselor and let us know if you would like to try medication to help with your anxiety. - Make an appointment to get your pap smear.  - Try the following to help you sleep better:  - limit naps during the day  - no screens (TV, phone, tablet, computer) at least 1-2 hours before bedtime.  - have a quiet and dark sleeping environment.  - no large meals or drinks about 1 hour before bed.  - Avoid caffeine after 3pm.  - Exercise or move your body regularly every day.  - You can also try melatonin 5 mg over the counter. Take this 1-2 hours before bed. You can increase to 10mg  if this is not helpful. - If you are lying in bed for 30 mins-1 hour and aren't falling asleep, get out of bed and do something relaxing like reading (NO TV!) until you are tired.   We are checking some labs today, we will release these results to your MyChart.  Take care and seek immediate care sooner if you develop any concerns.   Dr. Ky Barban

## 2020-12-18 LAB — CBC
HCT: 35.9 % (ref 35.0–45.0)
Hemoglobin: 11 g/dL — ABNORMAL LOW (ref 11.7–15.5)
MCH: 24.8 pg — ABNORMAL LOW (ref 27.0–33.0)
MCHC: 30.6 g/dL — ABNORMAL LOW (ref 32.0–36.0)
MCV: 80.9 fL (ref 80.0–100.0)
MPV: 11.5 fL (ref 7.5–12.5)
Platelets: 320 10*3/uL (ref 140–400)
RBC: 4.44 10*6/uL (ref 3.80–5.10)
RDW: 16.3 % — ABNORMAL HIGH (ref 11.0–15.0)
WBC: 6 10*3/uL (ref 3.8–10.8)

## 2020-12-18 LAB — FERRITIN: Ferritin: 5 ng/mL — ABNORMAL LOW (ref 16–154)

## 2020-12-18 LAB — HEPATITIS C ANTIBODY
Hepatitis C Ab: NONREACTIVE
SIGNAL TO CUT-OFF: 0.01 (ref <1.00)

## 2020-12-18 LAB — HIV ANTIBODY (ROUTINE TESTING W REFLEX): HIV 1&2 Ab, 4th Generation: NONREACTIVE

## 2020-12-31 ENCOUNTER — Encounter: Payer: 59 | Admitting: Family Medicine

## 2020-12-31 NOTE — Progress Notes (Deleted)
    SUBJECTIVE:   CHIEF COMPLAINT / HPI:   Need for pap smear - G0 - Menses: normally every month - Sexually active: no - Contraception: abstinence - Cancer screening: never had, due   OBJECTIVE:   There were no vitals taken for this visit.  ***  ASSESSMENT/PLAN:   No problem-specific Assessment & Plan notes found for this encounter.     Myles Gip, DO [facility]   {    This will disappear when note is signed, click to select method of visit    :1}

## 2021-01-15 ENCOUNTER — Other Ambulatory Visit: Payer: Self-pay

## 2021-01-15 ENCOUNTER — Encounter: Payer: Self-pay | Admitting: Physician Assistant

## 2021-01-15 ENCOUNTER — Other Ambulatory Visit (HOSPITAL_COMMUNITY)
Admission: RE | Admit: 2021-01-15 | Discharge: 2021-01-15 | Disposition: A | Discharge status: Home / Self Care | Payer: 59 | Source: Ambulatory Visit | Attending: Family Medicine | Admitting: Family Medicine

## 2021-01-15 ENCOUNTER — Ambulatory Visit: Payer: 59 | Admitting: Physician Assistant

## 2021-01-15 VITALS — BP 106/72 | HR 103 | Temp 98.4°F | Resp 14 | Ht 63.0 in | Wt 118.1 lb

## 2021-01-15 DIAGNOSIS — Z124 Encounter for screening for malignant neoplasm of cervix: Secondary | ICD-10-CM | POA: Insufficient documentation

## 2021-01-15 DIAGNOSIS — F419 Anxiety disorder, unspecified: Secondary | ICD-10-CM | POA: Diagnosis not present

## 2021-01-15 NOTE — Progress Notes (Signed)
Established patient visit   Patient: Shelly Hansen   DOB: Sep 29, 1991   30 y.o. Female  MRN: [id number] Visit Date: 01/15/2021  Today's healthcare provider: Trinna Post, PA-C   Chief Complaint  Patient presents with  . Gynecologic Exam   Subjective    HPI   Patient presents for PAP smear today.   Anxiety, Follow-up  She was last seen for anxiety 1 months ago. Changes made at last visit include start hydroxyzine.   She reports excellent compliance with treatment. Takes as needed.  She reports excellent tolerance of treatment. She is having side effects. Drowsiness.  She feels her anxiety is moderate and Improved since last visit. Still reports some anxiety during the day. She would like to take medication to help her anxiety in the day that doesn't make her drowsy, but doesn't want to take something daily. She is established with a counselor once weekly.   Symptoms: No chest pain Yes difficulty concentrating  No dizziness No fatigue  Yes feelings of losing control Yes insomnia  Yes irritable No palpitations  Yes panic attacks Yes racing thoughts  No shortness of breath Yes sweating  Yes tremors/shakes    GAD-7 Results GAD-7 Generalized Anxiety Disorder Screening Tool 01/15/2021  1. Feeling Nervous, Anxious, or on Edge 3  2. Not Being Able to Stop or Control Worrying 2  3. Worrying Too Much About Different Things 2  4. Trouble Relaxing 2  5. Being So Restless it's Hard To Sit Still 2  6. Becoming Easily Annoyed or Irritable 2  7. Feeling Afraid As If Something Awful Might Happen 2  Total GAD-7 Score 15  Difficulty At Work, Home, or Getting  Along With Others? Very difficult    PHQ-9 Scores PHQ9 SCORE ONLY 01/15/2021 12/17/2020 07/02/2018  PHQ-9 Total Score 12 10 0    ---------------------------------------------------------------------------------------------------      Medications: Outpatient Medications Prior to Visit  Medication Sig  .  fluticasone (FLONASE) 50 MCG/ACT nasal spray Place 2 sprays into both nostrils daily.  . hydrOXYzine (ATARAX/VISTARIL) 10 MG tablet Take 1 tablet (10 mg total) by mouth at bedtime as needed.   No facility-administered medications prior to visit.    Review of Systems  All other systems reviewed and are negative.      Objective    BP 106/72 (BP Location: Right Arm, Patient Position: Sitting, Cuff Size: Normal)   Pulse (!) 103   Temp 98.4 F (36.9 C) (Oral)   Resp 14   Ht 5\' 3"  (1.6 m) Comment: per chart  Wt 118 lb 1.6 oz (53.6 kg)   SpO2 98%   BMI 20.92 kg/m     Physical Exam Constitutional:      Appearance: Normal appearance. She is normal weight.  Cardiovascular:     Rate and Rhythm: Normal rate.  Pulmonary:     Effort: Pulmonary effort is normal.  Genitourinary:    General: Normal vulva.     Vagina: Normal.     Cervix: No cervical motion tenderness or discharge.  Neurological:     Mental Status: She is alert and oriented to person, place, and time. Mental status is at baseline.  Psychiatric:        Mood and Affect: Mood normal.        Behavior: Behavior normal.       No results found for any visits on 01/15/21.  Assessment & Plan    1. Anxiety  Continue hydroxyzine as needed. Continue therapy. Will  talk to therapist about further medication. Counseled that medication to control daily anxiety would be maintenance medication like SSRI.   2. Cervical cancer screening  - Cytology - PAP   Return in about 6 months (around 07/18/2021).      I spent 30 minutes dedicated to the care of this patient on the date of this encounter to include pre-visit review of records, face-to-face time with the patient discussing anxiety, and post visit ordering of testing.     Trinna Post, PA-C  [facility] [phone] (phone) [phone] (fax)  [place]

## 2021-01-15 NOTE — Patient Instructions (Signed)

## 2021-01-17 LAB — CYTOLOGY - PAP: Diagnosis: NEGATIVE

## 2021-04-08 ENCOUNTER — Ambulatory Visit: Payer: Self-pay | Admitting: *Deleted

## 2021-04-08 NOTE — Telephone Encounter (Signed)
C/o ear pain since last Friday . Had some sneezing and headache . No cold sx now. C/o dizziness, fluid build up in left ear.  Patient reports she did not develop any worsening cold symptoms. C/o headache on Saturday. Tried to hold nose and blow, ear popped and felt "weird". Patient was listening to music with ear pieces today and noted she could not hear out of left ear. Denies pain in ear today. Patient would like appt but requesting to make sure provider is not out of network. Insurance is still [employer] Last provider she noted was out of network. Unable to remember name of provider. Please advise patient with appt. Care advise given. Patient verbalized understanding of care advise and to call back or go to [facility] or ED if symptoms worsen.

## 2021-04-08 NOTE — Telephone Encounter (Signed)
Reason for Disposition  Mild earache and ear congestion (fullness) occurring during air travel  Answer Assessment - Initial Assessment Questions 1. LOCATION: "Which ear is involved?"     *No Answer* 2. ONSET: "When did the ear start hurting"      *No Answer* 3. SEVERITY: "How bad is the pain?"  (Scale 1-10; mild, moderate or severe)   - MILD (1-3): doesn't interfere with normal activities    - MODERATE (4-7): interferes with normal activities or awakens from sleep    - SEVERE (8-10): excruciating pain, unable to do any normal activities      *No Answer* 4. URI SYMPTOMS: "Do you have a runny nose or cough?"     na 5. FEVER: "Do you have a fever?" If Yes, ask: "What is your temperature, how was it measured, and when did it start?"     na 6. CAUSE: "Have you been swimming recently?", "How often do you use Q-TIPS?", "Have you had any recent air travel or scuba diving?"     No  7. OTHER SYMPTOMS: "Do you have any other symptoms?" (e.g., headache, stiff neck, dizziness, vomiting, runny nose, decreased hearing)     Headache , decreased hearing in left ear, sneezing last week. 8. PREGNANCY: "Is there any chance you are pregnant?" "When was your last menstrual period?"     na  Protocols used: Bethann Punches

## 2021-04-08 NOTE — Telephone Encounter (Signed)
Spoke with patient and offered her an appt on Thursday but she said that she would just go to urgent care.

## 2021-12-23 ENCOUNTER — Encounter: Payer: Self-pay | Admitting: Family Medicine

## 2021-12-24 DIAGNOSIS — R69 Illness, unspecified: Secondary | ICD-10-CM | POA: Diagnosis not present

## 2021-12-25 ENCOUNTER — Other Ambulatory Visit: Payer: Self-pay

## 2021-12-25 ENCOUNTER — Ambulatory Visit (INDEPENDENT_AMBULATORY_CARE_PROVIDER_SITE_OTHER): Payer: 59 | Admitting: Nurse Practitioner

## 2021-12-25 ENCOUNTER — Encounter: Payer: Self-pay | Admitting: Nurse Practitioner

## 2021-12-25 VITALS — BP 110/70 | HR 87 | Temp 98.3°F | Resp 18 | Ht 63.0 in | Wt 117.9 lb

## 2021-12-25 DIAGNOSIS — F331 Major depressive disorder, recurrent, moderate: Secondary | ICD-10-CM | POA: Diagnosis not present

## 2021-12-25 DIAGNOSIS — F419 Anxiety disorder, unspecified: Secondary | ICD-10-CM

## 2021-12-25 DIAGNOSIS — R69 Illness, unspecified: Secondary | ICD-10-CM | POA: Diagnosis not present

## 2021-12-25 MED ORDER — HYDROXYZINE HCL 10 MG PO TABS: 10.0000 mg | ORAL_TABLET | Freq: Every evening | ORAL | 0 refills | Status: DC | PRN

## 2021-12-25 MED ORDER — BUSPIRONE HCL 5 MG PO TABS: 5.0000 mg | ORAL_TABLET | Freq: Three times a day (TID) | ORAL | 0 refills | Status: DC

## 2021-12-25 NOTE — Progress Notes (Signed)
? ?BP 110/70   Pulse 87   Temp 98.3 ?F (36.8 ?C) (Oral)   Resp 18   Ht '5\' 3"'$  (1.6 m)   Wt 117 lb 14.4 oz (53.5 kg)   SpO2 99%   BMI 20.89 kg/m?   ? ?Subjective:  ? ? Patient ID: Shelly Hansen, female    DOB: 04-Jun-1991, 31 y.o.   MRN: [id number] ? ?HPI: ?Shelly Hansen is a 31 y.o. female ? ?Chief Complaint  ?Patient presents with  ? Medication Refill  ? Anxiety  ? ?Anxiety/depression : [facility] here in [place], she just started seeing her again. Saw her yesterday and plan to see her weekly. They are working on a possible diagnosis of PTSD. She has not been taking hydroxyzine since November she ran out.  She says she would like to take something for her anxiety throughout the day that does not make her sleepy like the hydroxyzine.  Discussed her increase depression and anxiety screening.  Recommendation is to take a more consistent treatment.  She does not want to take a medication every day.  Discussed that she put suicidal thoughts on her questionnaire form.  She denies any plan of suicide. She says that they are more thoughts like it would be better if she was not hear. She denies any thoughts of acting on these thoughts. She says that she is meeting with her counselor next week and she will come back to see me in four weeks unless symptoms get worse.  ? ?Depression screen [facility] 2/9 12/25/2021 01/15/2021 12/17/2020 07/02/2018 04/28/2018  ?Decreased Interest 3 2 - 0 0  ?Down, Depressed, Hopeless '3 1 2 '$ 0 0  ?PHQ - 2 Score '6 3 2 '$ 0 0  ?Altered sleeping '3 3 2 '$ - -  ?Tired, decreased energy '3 2 2 '$ - -  ?Change in appetite '3 1 1 '$ - -  ?Feeling bad or failure about yourself  '3 1 2 '$ - -  ?Trouble concentrating '3 2 1 '$ - -  ?Moving slowly or fidgety/restless 1 0 0 - -  ?Suicidal thoughts 2 0 - - -  ?PHQ-9 Score '24 12 10 '$ - -  ?Difficult doing work/chores - Somewhat difficult Somewhat difficult - -  ?  ?GAD 7 : Generalized Anxiety Score 12/25/2021 01/15/2021  ?Nervous, Anxious, on Edge 3 3  ?Control/stop worrying  3 2  ?Worry too much - different things 3 2  ?Trouble relaxing 3 2  ?Restless 3 2  ?Easily annoyed or irritable 3 2  ?Afraid - awful might happen 3 2  ?Total GAD 7 Score 21 15  ?Anxiety Difficulty Somewhat difficult Very difficult  ? ?  ?Relevant past medical, surgical, family and social history reviewed and updated as indicated. Interim medical history since our last visit reviewed. ?Allergies and medications reviewed and updated. ? ?Review of Systems ? ?Constitutional: Negative for fever or weight change.  ?Respiratory: Negative for cough and shortness of breath.   ?Cardiovascular: Negative for chest pain or palpitations.  ?Gastrointestinal: Negative for abdominal pain, no bowel changes.  ?Musculoskeletal: Negative for gait problem or joint swelling.  ?Skin: Negative for rash.  ?Neurological: Negative for dizziness or headache.  ?No other specific complaints in a complete review of systems (except as listed in HPI above).  ? ?   ?Objective:  ?  ?BP 110/70   Pulse 87   Temp 98.3 ?F (36.8 ?C) (Oral)   Resp 18   Ht '5\' 3"'$  (1.6 m)   Wt 117 lb 14.4 oz (  53.5 kg)   SpO2 99%   BMI 20.89 kg/m?   ?Wt Readings from Last 3 Encounters:  ?12/25/21 117 lb 14.4 oz (53.5 kg)  ?01/15/21 118 lb 1.6 oz (53.6 kg)  ?12/17/20 115 lb 9.6 oz (52.4 kg)  ?  ?Physical Exam ? ?Constitutional: Patient appears well-developed and well-nourished.  No distress.  ?HEENT: head atraumatic, normocephalic, pupils equal and reactive to light, neck supple ?Cardiovascular: Normal rate, regular rhythm and normal heart sounds.  No murmur heard. No BLE edema. ?Pulmonary/Chest: Effort normal and breath sounds normal. No respiratory distress. ?Abdominal: Soft.  There is no tenderness. ?Psychiatric: Patient has a normal mood and affect. behavior is normal. Judgment and thought content normal.  ? ?Results for orders placed or performed in visit on 01/15/21  ?Cytology - PAP  ?Result Value Ref Range  ? Adequacy    ?  Satisfactory for evaluation;  transformation zone component PRESENT.  ? Diagnosis    ?  - Negative for intraepithelial lesion or malignancy (NILM)  ? ?   ?Assessment & Plan:  ? ?1. Anxiety ? ?- busPIRone (BUSPAR) 5 MG tablet; Take 1 tablet (5 mg total) by mouth 3 (three) times daily.  Dispense: 90 tablet; Refill: 0 ?- hydrOXYzine (ATARAX) 10 MG tablet; Take 1 tablet (10 mg total) by mouth at bedtime as needed.  Dispense: 30 tablet; Refill: 0 ? ?2. Moderate episode of recurrent major depressive disorder ([place]) ?-continue working with your counselor ? ?Follow up plan: ?Return in about 4 weeks (around 01/22/2022) for follow up. ? ? ? ? ? ?

## 2021-12-31 DIAGNOSIS — R69 Illness, unspecified: Secondary | ICD-10-CM | POA: Diagnosis not present

## 2022-01-14 DIAGNOSIS — R69 Illness, unspecified: Secondary | ICD-10-CM | POA: Diagnosis not present

## 2022-01-16 ENCOUNTER — Other Ambulatory Visit: Payer: Self-pay | Admitting: Nurse Practitioner

## 2022-01-16 DIAGNOSIS — F419 Anxiety disorder, unspecified: Secondary | ICD-10-CM

## 2022-01-17 NOTE — Telephone Encounter (Signed)
Requested medication (s) are due for refill today - no ? ?Requested medication (s) are on the active medication list -yes ? ?Future visit scheduled -yes ? ?Last refill: 12/25/21 #30 ? ?Notes to clinic: Request RF: request 90 days supply- new start/original Rx for #30- sent for review of request ? ?Requested Prescriptions  ?Pending Prescriptions Disp Refills  ? busPIRone (BUSPAR) 5 MG tablet [Pharmacy Med Name: BUSPIRONE HCL 5 MG TABLET] 270 tablet 1  ?  Sig: TAKE 1 TABLET BY MOUTH THREE TIMES A DAY  ?  ? Psychiatry: Anxiolytics/Hypnotics - Non-controlled Passed - 01/16/2022 10:36 AM  ?  ?  Passed - Valid encounter within last 12 months  ?  Recent Outpatient Visits   ? ?      ? 3 weeks ago Anxiety  ? [place], FNP  ? 1 year ago Anxiety  ? [facility] [place] [facility], [place], [place]  ? 1 year ago Need for hepatitis C screening test  ? [place], DO  ? 3 years ago Routine general medical examination at a health care facility  ? [place], NP  ? 4 years ago Acute bronchitis, unspecified organism  ? [place], [place]  ? ?  ?  ?Future Appointments   ? ?        ? In 5 days Reece Packer, Myna Hidalgo, [facility], [facility]  ? ?  ? ?  ?  ?  ? hydrOXYzine (ATARAX) 10 MG tablet [Pharmacy Med Name: HYDROXYZINE HCL 10 MG TABLET] 90 tablet 1  ?  Sig: TAKE 1 TABLET BY MOUTH AT BEDTIME AS NEEDED.  ?  ? Ear, Nose, and Throat:  Antihistamines 2 Failed - 01/16/2022 10:36 AM  ?  ?  Failed - Cr in normal range and within 360 days  ?  Creat  ?Date Value Ref Range Status  ?04/28/2018 0.71 0.50 - 1.10 mg/dL Final  ?  ?  ?  ?  Passed - Valid encounter within last 12 months  ?  Recent Outpatient Visits   ? ?      ? 3 weeks ago Anxiety  ? [place], FNP  ? 1 year ago Anxiety  ? [facility] [place], [place], [place]  ?  1 year ago Need for hepatitis C screening test  ? [place], DO  ? 3 years ago Routine general medical examination at a health care facility  ? [place], NP  ? 4 years ago Acute bronchitis, unspecified organism  ? [place], [place]  ? ?  ?  ?Future Appointments   ? ?        ? In 5 days Bo Merino, [facility], [place]  ? ?  ? ?  ?  ?  ? ? ? ?Requested Prescriptions  ?Pending Prescriptions Disp Refills  ? busPIRone (BUSPAR) 5 MG tablet [Pharmacy Med Name: BUSPIRONE HCL 5 MG TABLET] 270 tablet 1  ?  Sig: TAKE 1 TABLET BY MOUTH THREE TIMES A DAY  ?  ? Psychiatry: Anxiolytics/Hypnotics - Non-controlled Passed - 01/16/2022 10:36 AM  ?  ?  Passed - Valid encounter within last 12 months  ?  Recent Outpatient Visits   ? ?      ?  3 weeks ago Anxiety  ? [place], FNP  ? 1 year ago Anxiety  ? [facility] [place], [place], [place]  ? 1 year ago Need for hepatitis C screening test  ? [place], DO  ? 3 years ago Routine general medical examination at a health care facility  ? [place], NP  ? 4 years ago Acute bronchitis, unspecified organism  ? [place], [place]  ? ?  ?  ?Future Appointments   ? ?        ? In 5 days Reece Packer, Myna Hidalgo, [facility], [facility]  ? ?  ? ?  ?  ?  ? hydrOXYzine (ATARAX) 10 MG tablet [Pharmacy Med Name: HYDROXYZINE HCL 10 MG TABLET] 90 tablet 1  ?  Sig: TAKE 1 TABLET BY MOUTH AT BEDTIME AS NEEDED.  ?  ? Ear, Nose, and Throat:  Antihistamines 2 Failed - 01/16/2022 10:36 AM  ?  ?  Failed - Cr in normal range and within 360 days  ?  Creat  ?Date Value Ref Range Status  ?04/28/2018 0.71 0.50 - 1.10 mg/dL Final  ?  ?  ?  ?  Passed - Valid encounter within last 12 months  ?  Recent  Outpatient Visits   ? ?      ? 3 weeks ago Anxiety  ? [place], FNP  ? 1 year ago Anxiety  ? [facility] [place], [place], [place]  ? 1 year ago Need for hepatitis C screening test  ? [place], DO  ? 3 years ago Routine general medical examination at a health care facility  ? [place], NP  ? 4 years ago Acute bronchitis, unspecified organism  ? [place], [place]  ? ?  ?  ?Future Appointments   ? ?        ? In 5 days Bo Merino, [facility], [place]  ? ?  ? ?  ?  ?  ? ? ? ?

## 2022-01-22 ENCOUNTER — Ambulatory Visit (INDEPENDENT_AMBULATORY_CARE_PROVIDER_SITE_OTHER): Payer: 59 | Admitting: Nurse Practitioner

## 2022-01-22 ENCOUNTER — Other Ambulatory Visit: Payer: Self-pay

## 2022-01-22 ENCOUNTER — Encounter: Payer: Self-pay | Admitting: Nurse Practitioner

## 2022-01-22 VITALS — BP 110/70 | HR 100 | Temp 98.3°F | Resp 18 | Ht 63.0 in | Wt 120.1 lb

## 2022-01-22 DIAGNOSIS — F99 Mental disorder, not otherwise specified: Secondary | ICD-10-CM

## 2022-01-22 DIAGNOSIS — F419 Anxiety disorder, unspecified: Secondary | ICD-10-CM | POA: Diagnosis not present

## 2022-01-22 DIAGNOSIS — F5105 Insomnia due to other mental disorder: Secondary | ICD-10-CM | POA: Insufficient documentation

## 2022-01-22 DIAGNOSIS — F4310 Post-traumatic stress disorder, unspecified: Secondary | ICD-10-CM | POA: Insufficient documentation

## 2022-01-22 DIAGNOSIS — F900 Attention-deficit hyperactivity disorder, predominantly inattentive type: Secondary | ICD-10-CM | POA: Insufficient documentation

## 2022-01-22 DIAGNOSIS — R69 Illness, unspecified: Secondary | ICD-10-CM | POA: Diagnosis not present

## 2022-01-22 MED ORDER — BUSPIRONE HCL 5 MG PO TABS: 5.0000 mg | ORAL_TABLET | Freq: Three times a day (TID) | ORAL | 3 refills | Status: DC

## 2022-01-22 MED ORDER — HYDROXYZINE HCL 10 MG PO TABS: 10.0000 mg | ORAL_TABLET | Freq: Every evening | ORAL | 3 refills | Status: DC | PRN

## 2022-01-22 NOTE — Progress Notes (Signed)
? ?BP 110/70   Pulse 100   Temp 98.3 ?F (36.8 ?C) (Oral)   Resp 18   Ht '5\' 3"'$  (1.6 m)   Wt 120 lb 1.6 oz (54.5 kg)   SpO2 94%   BMI 21.27 kg/m?   ? ?Subjective:  ? ? Patient ID: Shelly Hansen, female    DOB: 1990/11/08, 31 y.o.   MRN: [id number] ? ?HPI: ?Shelly Hansen is a 31 y.o. female ? ?Chief Complaint  ?Patient presents with  ? Follow-up  ?  4 week recheck  ? ?Anxiety/ PTSD/ ADHD/insomnia: She is currently taking 2 buspar a day and has not been taking the hydroxyzine regularly but is going to start. She says that she has been seeing her therapist once a week. She says she has been diagnosed with PTSD and ADHD. She says she has been referred to a ADHD specialist and is working on making that appointment.  She says she is doing better. She says she has stopped drinking soda and is being physically active.  Will continue with current treatment plan.  Refills sent.  ? ? ?  01/22/2022  ?  1:35 PM 12/25/2021  ?  9:49 AM 01/15/2021  ?  2:16 PM 12/17/2020  ? 11:06 AM 07/02/2018  ?  2:32 PM  ?Depression screen PHQ 2/9  ?Decreased Interest '2 3 2  '$ 0  ?Down, Depressed, Hopeless '2 3 1 2 '$ 0  ?PHQ - 2 Score '4 6 3 2 '$ 0  ?Altered sleeping '2 3 3 2   '$ ?Tired, decreased energy '2 3 2 2   '$ ?Change in appetite '1 3 1 1   '$ ?Feeling bad or failure about yourself  '1 3 1 2   '$ ?Trouble concentrating '3 3 2 1   '$ ?Moving slowly or fidgety/restless 0 1 0 0   ?Suicidal thoughts 0 2 0    ?PHQ-9 Score '13 24 12 10   '$ ?Difficult doing work/chores Somewhat difficult  Somewhat difficult Somewhat difficult   ?  ? ?  01/22/2022  ?  1:36 PM 12/25/2021  ?  9:52 AM 01/15/2021  ?  2:50 PM  ?GAD 7 : Generalized Anxiety Score  ?Nervous, Anxious, on Edge '2 3 3  '$ ?Control/stop worrying '2 3 2  '$ ?Worry too much - different things '2 3 2  '$ ?Trouble relaxing '1 3 2  '$ ?Restless '1 3 2  '$ ?Easily annoyed or irritable '1 3 2  '$ ?Afraid - awful might happen '2 3 2  '$ ?Total GAD 7 Score '11 21 15  '$ ?Anxiety Difficulty Somewhat difficult Somewhat difficult Very difficult  ? ?Relevant past medical,  surgical, family and social history reviewed and updated as indicated. Interim medical history since our last visit reviewed. ?Allergies and medications reviewed and updated. ? ?Review of Systems ? ?Constitutional: Negative for fever or weight change.  ?Respiratory: Negative for cough and shortness of breath.   ?Cardiovascular: Negative for chest pain or palpitations.  ?Gastrointestinal: Negative for abdominal pain, no bowel changes.  ?Musculoskeletal: Negative for gait problem or joint swelling.  ?Skin: Negative for rash.  ?Neurological: Negative for dizziness or headache.  ?No other specific complaints in a complete review of systems (except as listed in HPI above).  ? ?   ?Objective:  ?  ?BP 110/70   Pulse 100   Temp 98.3 ?F (36.8 ?C) (Oral)   Resp 18   Ht '5\' 3"'$  (1.6 m)   Wt 120 lb 1.6 oz (54.5 kg)   SpO2 94%   BMI 21.27 kg/m?   ?Wt Readings  from Last 3 Encounters:  ?01/22/22 120 lb 1.6 oz (54.5 kg)  ?12/25/21 117 lb 14.4 oz (53.5 kg)  ?01/15/21 118 lb 1.6 oz (53.6 kg)  ?  ?Physical Exam ? ?Constitutional: Patient appears well-developed and well-nourished. No distress.  ?HEENT: head atraumatic, normocephalic, pupils equal and reactive to light,  neck supple ?Cardiovascular: Normal rate, regular rhythm and normal heart sounds.  No murmur heard. No BLE edema. ?Pulmonary/Chest: Effort normal and breath sounds normal. No respiratory distress. ?Abdominal: Soft.  There is no tenderness. ?Psychiatric: Patient has a normal mood and affect. behavior is normal. Judgment and thought content normal.  ?Results for orders placed or performed in visit on 01/15/21  ?Cytology - PAP  ?Result Value Ref Range  ? Adequacy    ?  Satisfactory for evaluation; transformation zone component PRESENT.  ? Diagnosis    ?  - Negative for intraepithelial lesion or malignancy (NILM)  ? ?   ?Assessment & Plan:  ? ?1. Anxiety ?-continue with therapy ?- hydrOXYzine (ATARAX) 10 MG tablet; Take 1 tablet (10 mg total) by mouth at bedtime as  needed.  Dispense: 90 tablet; Refill: 3 ?- busPIRone (BUSPAR) 5 MG tablet; Take 1 tablet (5 mg total) by mouth 3 (three) times daily.  Dispense: 90 tablet; Refill: 3 ? ?2. PTSD (post-traumatic stress disorder) ?-continue with therapy ? ?3. Insomnia due to other mental disorder ?-take hydroxyzine as needed ? ?4. Attention deficit hyperactivity disorder (ADHD), predominantly inattentive type ?-make appointment with ADHD speciallist ? ?Follow up plan: ?Return in about 3 months (around 04/23/2022) for follow up. ? ? ? ? ? ?

## 2022-02-12 ENCOUNTER — Ambulatory Visit (INDEPENDENT_AMBULATORY_CARE_PROVIDER_SITE_OTHER): Payer: 59 | Admitting: Nurse Practitioner

## 2022-02-12 ENCOUNTER — Encounter: Payer: Self-pay | Admitting: Nurse Practitioner

## 2022-02-12 VITALS — BP 110/68 | HR 96 | Temp 98.8°F | Resp 16 | Ht 63.0 in | Wt 122.6 lb

## 2022-02-12 DIAGNOSIS — Z131 Encounter for screening for diabetes mellitus: Secondary | ICD-10-CM

## 2022-02-12 DIAGNOSIS — Z0000 Encounter for general adult medical examination without abnormal findings: Secondary | ICD-10-CM

## 2022-02-12 DIAGNOSIS — D509 Iron deficiency anemia, unspecified: Secondary | ICD-10-CM

## 2022-02-12 NOTE — Progress Notes (Signed)
Name: Shelly Hansen   MRN: [id number]    DOB: 10/29/1990   Date:02/12/2022 ? ?     Progress Note ? ?Subjective ? ?Chief Complaint ? ?Chief Complaint  ?Patient presents with  ? Annual Exam  ? ? ?HPI ? ?Patient presents for annual CPE. ? ?Diet: She says she is a picky eater, sometimes goes without eating, she might eat two times a day, she says she has cut out a lot of sugar and is drinking more water ?Exercise: weight lifting, three times a week for about 30-45 min a day ?Sleep: she says she does have trouble sleeping, she does have a prescription for hydroxyzine and she says she is going to start taking it to help get sleep ? ?[place] [facility]  ?AUDIT-C Score 1  ? ?  ? ?Depression: Phq 9 is  negative ? ?  02/12/2022  ?  2:31 PM 01/22/2022  ?  1:35 PM 12/25/2021  ?  9:49 AM 01/15/2021  ?  2:16 PM 12/17/2020  ? 11:06 AM  ?Depression screen PHQ 2/9  ?Decreased Interest 1 2 3 2    ?Down, Depressed, Hopeless 1 2 3 1 2   ?PHQ - 2 Score 2 4 6 3 2   ?Altered sleeping 0 2 3 3 2   ?Tired, decreased energy 1 2 3 2 2   ?Change in appetite 0 1 3 1 1   ?Feeling bad or failure about yourself  0 1 3 1 2   ?Trouble concentrating 0 3 3 2 1   ?Moving slowly or fidgety/restless 0 0 1 0 0  ?Suicidal thoughts 0 0 2 0   ?PHQ-9 Score 3 13 24 12 10   ?Difficult doing work/chores Not difficult at all Somewhat difficult  Somewhat difficult Somewhat difficult  ? ?Hypertension: ?BP Readings from Last 3 Encounters:  ?02/12/22 110/68  ?01/22/22 110/70  ?12/25/21 110/70  ? ?Obesity: ?Wt Readings from Last 3 Encounters:  ?02/12/22 122 lb 9.6 oz (55.6 kg)  ?01/22/22 120 lb 1.6 oz (54.5 kg)  ?12/25/21 117 lb 14.4 oz (53.5 kg)  ? ?BMI Readings from Last 3 Encounters:  ?02/12/22 21.72 kg/m?  ?01/22/22 21.27 kg/m?  ?12/25/21 20.89 kg/m?  ?  ? ?Vaccines:  ?HPV: up to at age 55 , ask insurance if age between 86-45  ?Shingrix: 5-64 yo and ask insurance if covered when patient above 68 yo ?Pneumonia:  educated and  discussed with patient. ?Flu:  educated and discussed with patient. ? ?Hep C Screening: 12/17/2020 ?STD testing and prevention (HIV/chl/gon/syphilis): 12/17/2020 ?Intimate partner violence:none ?Sexual History : not sexually active ?Menstrual History/LMP/Abnormal Bleeding: [facility]: 01/14/2022, regular ?Incontinence Symptoms:  ? ?Breast cancer:  ?- Last Mammogram: no concerns, does not qualify ?- BRCA gene screening: none ? ?Osteoporosis: Discussed high calcium and vitamin D supplementation, weight bearing exercises ? ?Cervical cancer screening: 01/15/2021 ? ?Skin cancer: Discussed monitoring for atypical lesions  ?Colorectal cancer: no concerns does not qualify   ?Lung cancer:   Low Dose CT Chest recommended if Age 57-80 years, 20 pack-year currently smoking OR have quit w/in 15years. Patient does not qualify.   ?ECG: none ? ?Lipids: ?No results found for: CHOL ?No results found for: HDL ?No results found for: [place] ?No results found for: TRIG ?No results found for: CHOLHDL ?No results found for: LDLDIRECT ? ?Glucose: ?Glucose, Bld  ?Date Value Ref Range Status  ?04/28/2018 107 65 - 139 mg/dL Final  ?  Comment:  ?  Marland Kitchen ?  Non-fasting reference interval ?. ?  ? ? ?Patient Active Problem List  ? Diagnosis Date Noted  ? PTSD (post-traumatic stress disorder) 01/22/2022  ? Insomnia due to other mental disorder 01/22/2022  ? Attention deficit hyperactivity disorder (ADHD), predominantly inattentive type 01/22/2022  ? Anxiety 12/17/2020  ? Iron deficiency anemia 05/03/2018  ? Pap smear of cervix declined 04/28/2018  ? Well woman exam without gynecological exam 12/14/2015  ? Allergic rhinitis 12/14/2015  ? ? ?Past Surgical History:  ?Procedure Laterality Date  ? MANDIBLE RECONSTRUCTION Right 2012  ? ? ?Family History  ?Problem Relation Age of Onset  ? Diabetes Father   ? Hypertension Mother   ? Healthy Brother   ? Dementia Maternal Grandmother   ? COPD Maternal Grandfather   ? Diabetes Paternal Grandfather   ? ? ?Social  History  ? ?Socioeconomic History  ? Marital status: Single  ?  Spouse name: Not on file  ? Number of children: Not on file  ? Years of education: Not on file  ? Highest education level: Associate degree: academic program  ?Occupational History  ? Occupation: Corporate treasurer  ?Tobacco Use  ? Smoking status: Never  ? Smokeless tobacco: Never  ?Substance and Sexual Activity  ? Alcohol use: No  ?  Alcohol/week: 0.0 standard drinks  ? Drug use: No  ? Sexual activity: Never  ?Other Topics Concern  ? Not on file  ?Social History Narrative  ? 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 ?Social Determinants of Health  ? ?Financial Resource Strain: Medium Risk  ? Difficulty of Paying Living Expenses: Somewhat hard  ?Food Insecurity: No Food Insecurity  ? Worried About Charity fundraiser in the Last Year: Never true  ? Ran Out of Food in the Last Year: Never true  ?Transportation Needs: No Transportation Needs  ? Lack of Transportation (Medical): No  ? Lack of Transportation (Non-Medical): No  ?Physical Activity: Sufficiently Active  ? Days of Exercise per Week: 3 days  ? Minutes of Exercise per Session: 50 min  ?Stress: No Stress Concern Present  ? Feeling of Stress : Only a little  ?Social Connections: Moderately Integrated  ? Frequency of Communication with Friends and Family: More than three times a week  ? Frequency of Social Gatherings with Friends and Family: More than three times a week  ? Attends Religious Services: More than 4 times per year  ? Active Member of Clubs or Organizations: Yes  ? Attends Archivist Meetings: More than 4 times per year  ? Marital Status: Never married  ?Intimate Partner Violence: Not At Risk  ? Fear of Current or Ex-Partner: No  ? Emotionally Abused: No  ? Physically Abused: No  ? Sexually Abused: No  ? ? ? ?Current Outpatient Medications:  ?  busPIRone  (BUSPAR) 5 MG tablet, Take 1 tablet (5 mg total) by mouth 3 (three) times daily., Disp: 90 tablet, Rfl: 3 ?  fluticasone (FLONASE) 50 MCG/ACT nasal spray, Place 2 sprays into both nostrils daily., Disp: 16 g, Rfl: 6 ?  hydrOXYzine (ATARAX) 10 MG tablet, Take 1 tablet (10 mg total) by mouth at bedtime as needed., Disp: 90 tablet, Rfl: 3 ? ?No Known Allergies ? ? ?ROS ? ?Constitutional: Negative for fever or weight change.  ?Respiratory: Negative for cough and shortness of breath.   ?Cardiovascular: Negative  for chest pain or palpitations.  ?Gastrointestinal: Negative for abdominal pain, no bowel changes.  ?Musculoskeletal: Negative for gait problem or joint swelling.  ?Skin: Negative for rash.  ?Neurological: Negative for dizziness or headache.  ?No other specific complaints in a complete review of systems (except as listed in HPI above).  ? ?Objective ? ?Vitals:  ? 02/12/22 1429  ?BP: 110/68  ?Pulse: 96  ?Resp: 16  ?Temp: 98.8 ?F (37.1 ?C)  ?SpO2: 98%  ?Weight: 122 lb 9.6 oz (55.6 kg)  ?Height: $RemoveB'5\' 3"'OhOxUOfM$  (1.6 m)  ? ? ?Body mass index is 21.72 kg/m?. ? ?Physical Exam ? ?Constitutional: Patient appears well-developed and well-nourished. No distress.  ?HENT: Head: Normocephalic and atraumatic. Ears: B TMs ok, no erythema or effusion; Nose: Nose normal. Mouth/Throat: Oropharynx is clear and moist. No oropharyngeal exudate.  ?Eyes: Conjunctivae and EOM are normal. Pupils are equal, round, and reactive to light. No scleral icterus.  ?Neck: Normal range of motion. Neck supple. No JVD present. No thyromegaly present.  ?Cardiovascular: Normal rate, regular rhythm and normal heart sounds.  No murmur heard. No BLE edema. ?Pulmonary/Chest: Effort normal and breath sounds normal. No respiratory distress. ?Abdominal: Soft. Bowel sounds are normal, no distension. There is no tenderness. no masses ?Breast: no lumps or masses, no nipple discharge or rashes ?Neurological: he is alert and oriented to person, place, and time. No cranial  nerve deficit. Coordination, balance, strength, speech and gait are normal.  ?Skin: Skin is warm and dry. No rash noted. No erythema.  ?Psychiatric: Patient has a normal mood and affect. behavior is normal. Judgment

## 2022-02-13 ENCOUNTER — Other Ambulatory Visit: Payer: Self-pay | Admitting: Nurse Practitioner

## 2022-02-13 DIAGNOSIS — D509 Iron deficiency anemia, unspecified: Secondary | ICD-10-CM

## 2022-02-13 LAB — COMPLETE METABOLIC PANEL WITH GFR
AG Ratio: 1.7 (calc) (ref 1.0–2.5)
ALT: 8 U/L (ref 6–29)
AST: 19 U/L (ref 10–30)
Albumin: 4.4 g/dL (ref 3.6–5.1)
Alkaline phosphatase (APISO): 36 U/L (ref 31–125)
BUN: 13 mg/dL (ref 7–25)
CO2: 25 mmol/L (ref 20–32)
Calcium: 9.2 mg/dL (ref 8.6–10.2)
Chloride: 107 mmol/L (ref 98–110)
Creat: 0.76 mg/dL (ref 0.50–0.96)
Globulin: 2.6 g/dL (calc) (ref 1.9–3.7)
Glucose, Bld: 82 mg/dL (ref 65–99)
Potassium: 4.5 mmol/L (ref 3.5–5.3)
Sodium: 140 mmol/L (ref 135–146)
Total Bilirubin: 1.1 mg/dL (ref 0.2–1.2)
Total Protein: 7 g/dL (ref 6.1–8.1)
eGFR: 109 mL/min/{1.73_m2} (ref >=60)

## 2022-02-13 LAB — IRON,TIBC AND FERRITIN PANEL
%SAT: 13 % (calc) — ABNORMAL LOW (ref 16–45)
Ferritin: 5 ng/mL — ABNORMAL LOW (ref 16–154)
Iron: 55 ug/dL (ref 40–190)
TIBC: 418 mcg/dL (calc) (ref 250–450)

## 2022-02-13 LAB — CBC WITH DIFFERENTIAL/PLATELET
Absolute Monocytes: 775 cells/uL (ref 200–950)
Basophils Absolute: 50 cells/uL (ref 0–200)
Basophils Relative: 0.8 %
Eosinophils Absolute: 174 cells/uL (ref 15–500)
Eosinophils Relative: 2.8 %
HCT: 31.4 % — ABNORMAL LOW (ref 35.0–45.0)
Hemoglobin: 9.6 g/dL — ABNORMAL LOW (ref 11.7–15.5)
Lymphs Abs: 2480 cells/uL (ref 850–3900)
MCH: 23.4 pg — ABNORMAL LOW (ref 27.0–33.0)
MCHC: 30.6 g/dL — ABNORMAL LOW (ref 32.0–36.0)
MCV: 76.4 fL — ABNORMAL LOW (ref 80.0–100.0)
MPV: 11.1 fL (ref 7.5–12.5)
Monocytes Relative: 12.5 %
Neutro Abs: 2722 cells/uL (ref 1500–7800)
Neutrophils Relative %: 43.9 %
Platelets: 310 10*3/uL (ref 140–400)
RBC: 4.11 10*6/uL (ref 3.80–5.10)
RDW: 15.3 % — ABNORMAL HIGH (ref 11.0–15.0)
Total Lymphocyte: 40 %
WBC: 6.2 10*3/uL (ref 3.8–10.8)

## 2022-02-13 MED ORDER — IRON (FERROUS SULFATE) 325 (65 FE) MG PO TABS: 325.0000 mg | ORAL_TABLET | Freq: Every day | ORAL | 1 refills | Status: AC

## 2022-02-22 ENCOUNTER — Other Ambulatory Visit: Payer: Self-pay | Admitting: Nurse Practitioner

## 2022-02-22 DIAGNOSIS — F419 Anxiety disorder, unspecified: Secondary | ICD-10-CM

## 2022-02-24 NOTE — Telephone Encounter (Signed)
Requested Prescriptions  ?Pending Prescriptions Disp Refills  ?? busPIRone (BUSPAR) 5 MG tablet [Pharmacy Med Name: BUSPIRONE HCL 5 MG TABLET] 270 tablet 2  ?  Sig: TAKE 1 TABLET BY MOUTH THREE TIMES A DAY  ?  ? Psychiatry: Anxiolytics/Hypnotics - Non-controlled Passed - 02/22/2022 10:36 AM  ?  ?  Passed - Valid encounter within last 12 months  ?  Recent Outpatient Visits   ?      ? 1 week ago Physical exam, annual  ? [facility] Serafina Royals F, FNP  ? 1 month ago Anxiety  ? [place], FNP  ? 2 months ago Anxiety  ? [place], FNP  ? 1 year ago Anxiety  ? [facility] [place], Wendee [place], [place]  ? 1 year ago Need for hepatitis C screening test  ? [place], DO  ?  ?  ?Future Appointments   ?        ? In 2 months Reece Packer, Myna Hidalgo, [facility], [place]  ?  ? ?  ?  ?  ? ?

## 2022-04-11 DIAGNOSIS — R69 Illness, unspecified: Secondary | ICD-10-CM | POA: Diagnosis not present

## 2022-04-18 DIAGNOSIS — R69 Illness, unspecified: Secondary | ICD-10-CM | POA: Diagnosis not present

## 2022-04-25 DIAGNOSIS — R69 Illness, unspecified: Secondary | ICD-10-CM | POA: Diagnosis not present

## 2022-04-28 ENCOUNTER — Ambulatory Visit: Payer: 59 | Admitting: Nurse Practitioner

## 2022-05-02 ENCOUNTER — Ambulatory Visit (INDEPENDENT_AMBULATORY_CARE_PROVIDER_SITE_OTHER): Payer: 59 | Admitting: Nurse Practitioner

## 2022-05-02 ENCOUNTER — Encounter: Payer: Self-pay | Admitting: Nurse Practitioner

## 2022-05-02 ENCOUNTER — Other Ambulatory Visit: Payer: Self-pay

## 2022-05-02 VITALS — BP 110/68 | HR 84 | Temp 98.8°F | Resp 18 | Ht 63.0 in | Wt 123.2 lb

## 2022-05-02 DIAGNOSIS — F4310 Post-traumatic stress disorder, unspecified: Secondary | ICD-10-CM | POA: Diagnosis not present

## 2022-05-02 DIAGNOSIS — F99 Mental disorder, not otherwise specified: Secondary | ICD-10-CM

## 2022-05-02 DIAGNOSIS — F419 Anxiety disorder, unspecified: Secondary | ICD-10-CM | POA: Diagnosis not present

## 2022-05-02 DIAGNOSIS — F5105 Insomnia due to other mental disorder: Secondary | ICD-10-CM | POA: Diagnosis not present

## 2022-05-02 DIAGNOSIS — F900 Attention-deficit hyperactivity disorder, predominantly inattentive type: Secondary | ICD-10-CM | POA: Diagnosis not present

## 2022-05-02 DIAGNOSIS — F331 Major depressive disorder, recurrent, moderate: Secondary | ICD-10-CM | POA: Diagnosis not present

## 2022-05-02 DIAGNOSIS — R69 Illness, unspecified: Secondary | ICD-10-CM | POA: Diagnosis not present

## 2022-05-02 MED ORDER — HYDROXYZINE HCL 10 MG PO TABS: 10.0000 mg | ORAL_TABLET | Freq: Every evening | ORAL | 3 refills | Status: DC | PRN

## 2022-05-02 MED ORDER — BUSPIRONE HCL 5 MG PO TABS: 5.0000 mg | ORAL_TABLET | Freq: Three times a day (TID) | ORAL | 3 refills | Status: DC

## 2022-05-02 NOTE — Assessment & Plan Note (Signed)
After last appointment patient stopped taking her BuSpar and going to therapy due to feeling better.  Patient then noticed that her anxiety was slowly growing.  Patient has restarted her BuSpar, hydroxyzine and is back in therapy.  Continue taking BuSpar 5 mg 3 times a day as needed and hydroxyzine 10 mg at bedtime.

## 2022-05-02 NOTE — Assessment & Plan Note (Signed)
Patient did not make her appointment with her ADHD specialist yet.  Patient states she is going to get that done.

## 2022-05-02 NOTE — Progress Notes (Signed)
BP 110/68   Pulse 84   Temp 98.8 F (37.1 C) (Oral)   Resp 18   Ht 5\' 3"  (1.6 m)   Wt 123 lb 3.2 oz (55.9 kg)   SpO2 94%   BMI 21.82 kg/m    Subjective:    Patient ID: Shelly Hansen, female    DOB: December 04, 1990, 31 y.o.   MRN: [id number]  HPI: Shelly Hansen is a 31 y.o. female  Chief Complaint  Patient presents with   Anxiety    3 month follow up   Anxiety/ PTSD/ ADHD/depression/insomnia: Patient has been seeing a just once a week.  She has been diagnosed with PTSD and ADHD.  She was referred to an ADHD specialist.  She has not gotten in with them yet but will work on making an appointment.   She says that after her last appointment she stopped taking her medication because she was feeling better.  She says she noticed quickly that the anxiety was returning. She says she is back to taking her buspar and is doing better. She says she has been taking the hydroxyzine when she needs it to help her sleep. She says the hydroxyzine has been helping her sleep when she takes it.      05/02/2022    2:52 PM 02/12/2022    2:31 PM 01/22/2022    1:35 PM 12/25/2021    9:49 AM 01/15/2021    2:16 PM  Depression screen PHQ 2/9  Decreased Interest 1 1 2 3 2   Down, Depressed, Hopeless 2 1 2 3 1   PHQ - 2 Score 3 2 4 6 3   Altered sleeping 1 0 2 3 3   Tired, decreased energy 1 1 2 3 2   Change in appetite 1 0 1 3 1   Feeling bad or failure about yourself  2 0 1 3 1   Trouble concentrating 3 0 3 3 2   Moving slowly or fidgety/restless 0 0 0 1 0  Suicidal thoughts 1 0 0 2 0  PHQ-9 Score 12 3 13 24 12   Difficult doing work/chores Somewhat difficult Not difficult at all Somewhat difficult  Somewhat difficult       05/02/2022    2:54 PM 01/22/2022    1:36 PM 12/25/2021    9:52 AM 01/15/2021    2:50 PM  GAD 7 : Generalized Anxiety Score  Nervous, Anxious, on Edge 2 2 3 3   Control/stop worrying 2 2 3 2   Worry too much - different things 2 2 3 2   Trouble relaxing 2 1 3 2   Restless 2 1 3 2   Easily annoyed or  irritable 2 1 3 2   Afraid - awful might happen 2 2 3 2   Total GAD 7 Score 14 11 21 15   Anxiety Difficulty Somewhat difficult Somewhat difficult Somewhat difficult Very difficult    Relevant past medical, surgical, family and social history reviewed and updated as indicated. Interim medical history since our last visit reviewed. Allergies and medications reviewed and updated.  Review of Systems  Constitutional: Negative for fever or weight change.  Respiratory: Negative for cough and shortness of breath.   Cardiovascular: Negative for chest pain or palpitations.  Gastrointestinal: Negative for abdominal pain, no bowel changes.  Musculoskeletal: Negative for gait problem or joint swelling.  Skin: Negative for rash.  Neurological: Negative for dizziness or headache.  No other specific complaints in a complete review of systems (except as listed in HPI above).      Objective:  BP 110/68   Pulse 84   Temp 98.8 F (37.1 C) (Oral)   Resp 18   Ht $R'5\' 3"'nf$  (1.6 m)   Wt 123 lb 3.2 oz (55.9 kg)   SpO2 94%   BMI 21.82 kg/m   Wt Readings from Last 3 Encounters:  05/02/22 123 lb 3.2 oz (55.9 kg)  02/12/22 122 lb 9.6 oz (55.6 kg)  01/22/22 120 lb 1.6 oz (54.5 kg)    Physical Exam  Constitutional: Patient appears well-developed and well-nourished. No distress.  HEENT: head atraumatic, normocephalic, pupils equal and reactive to light, neck supple Cardiovascular: Normal rate, regular rhythm and normal heart sounds.  No murmur heard. No BLE edema. Pulmonary/Chest: Effort normal and breath sounds normal. No respiratory distress. Abdominal: Soft.  There is no tenderness. Psychiatric: Patient has a normal mood and affect. behavior is normal. Judgment and thought content normal.  Results for orders placed or performed in visit on 02/12/22  CBC with Differential/Platelet  Result Value Ref Range   WBC 6.2 3.8 - 10.8 Thousand/uL   RBC 4.11 3.80 - 5.10 Million/uL   Hemoglobin 9.6 (L) 11.7 -  15.5 g/dL   HCT 31.4 (L) 35.0 - 45.0 %   MCV 76.4 (L) 80.0 - 100.0 fL   MCH 23.4 (L) 27.0 - 33.0 pg   MCHC 30.6 (L) 32.0 - 36.0 g/dL   RDW 15.3 (H) 11.0 - 15.0 %   Platelets 310 140 - 400 Thousand/uL   MPV 11.1 7.5 - 12.5 fL   Neutro Abs 2,722 1,500 - 7,800 cells/uL   Lymphs Abs 2,480 850 - 3,900 cells/uL   Absolute Monocytes 775 200 - 950 cells/uL   Eosinophils Absolute 174 15 - 500 cells/uL   Basophils Absolute 50 0 - 200 cells/uL   Neutrophils Relative % 43.9 %   Total Lymphocyte 40.0 %   Monocytes Relative 12.5 %   Eosinophils Relative 2.8 %   Basophils Relative 0.8 %  COMPLETE METABOLIC PANEL WITH GFR  Result Value Ref Range   Glucose, Bld 82 65 - 99 mg/dL   BUN 13 7 - 25 mg/dL   Creat 0.76 0.50 - 0.96 mg/dL   eGFR 109 > OR = 60 mL/min/1.47m2   BUN/Creatinine Ratio NOT APPLICABLE 6 - 22 (calc)   Sodium 140 135 - 146 mmol/L   Potassium 4.5 3.5 - 5.3 mmol/L   Chloride 107 98 - 110 mmol/L   CO2 25 20 - 32 mmol/L   Calcium 9.2 8.6 - 10.2 mg/dL   Total Protein 7.0 6.1 - 8.1 g/dL   Albumin 4.4 3.6 - 5.1 g/dL   Globulin 2.6 1.9 - 3.7 g/dL (calc)   AG Ratio 1.7 1.0 - 2.5 (calc)   Total Bilirubin 1.1 0.2 - 1.2 mg/dL   Alkaline phosphatase (APISO) 36 31 - 125 U/L   AST 19 10 - 30 U/L   ALT 8 6 - 29 U/L  Iron, TIBC and Ferritin Panel  Result Value Ref Range   Iron 55 40 - 190 mcg/dL   TIBC 418 250 - 450 mcg/dL (calc)   %SAT 13 (L) 16 - 45 % (calc)   Ferritin 5 (L) 16 - 154 ng/mL      Assessment & Plan:   Problem List Items Addressed This Visit       Other   Anxiety    After last appointment patient stopped taking her BuSpar and going to therapy due to feeling better.  Patient then noticed that her anxiety was slowly  growing.  Patient has restarted her BuSpar, hydroxyzine and is back in therapy.  Continue taking BuSpar 5 mg 3 times a day as needed and hydroxyzine 10 mg at bedtime.      Relevant Medications   hydrOXYzine (ATARAX) 10 MG tablet   busPIRone (BUSPAR) 5  MG tablet   PTSD (post-traumatic stress disorder) - Primary    After last appointment patient stopped taking her BuSpar and going to therapy due to feeling better.  Patient then noticed that her anxiety was slowly growing.  Patient has restarted her BuSpar, hydroxyzine and is back in therapy.  Continue taking BuSpar 5 mg 3 times a day as needed and hydroxyzine 10 mg at bedtime.      Relevant Medications   hydrOXYzine (ATARAX) 10 MG tablet   busPIRone (BUSPAR) 5 MG tablet   Insomnia due to other mental disorder    After last appointment patient stopped taking her BuSpar and going to therapy due to feeling better.  Patient then noticed that her anxiety was slowly growing.  Patient has restarted her BuSpar, hydroxyzine and is back in therapy.  Continue taking BuSpar 5 mg 3 times a day as needed and hydroxyzine 10 mg at bedtime.      Attention deficit hyperactivity disorder (ADHD), predominantly inattentive type    Patient did not make her appointment with her ADHD specialist yet.  Patient states she is going to get that done.      Moderate episode of recurrent major depressive disorder ([place])    After last appointment patient stopped taking her BuSpar and going to therapy due to feeling better.  Patient then noticed that her anxiety was slowly growing.  Patient has restarted her BuSpar, hydroxyzine and is back in therapy.  Continue taking BuSpar 5 mg 3 times a day as needed and hydroxyzine 10 mg at bedtime.      Relevant Medications   hydrOXYzine (ATARAX) 10 MG tablet   busPIRone (BUSPAR) 5 MG tablet     Follow up plan: Return in about 6 months (around 11/02/2022) for follow up.

## 2022-05-06 ENCOUNTER — Ambulatory Visit
Admission: RE | Admit: 2022-05-06 | Discharge: 2022-05-06 | Disposition: A | Discharge status: Home / Self Care | Payer: 59 | Attending: Family Medicine | Admitting: Family Medicine

## 2022-05-06 ENCOUNTER — Ambulatory Visit: Payer: Self-pay

## 2022-05-06 ENCOUNTER — Telehealth (INDEPENDENT_AMBULATORY_CARE_PROVIDER_SITE_OTHER): Payer: 59 | Admitting: Family Medicine

## 2022-05-06 ENCOUNTER — Ambulatory Visit
Admission: RE | Admit: 2022-05-06 | Discharge: 2022-05-06 | Disposition: A | Discharge status: Home / Self Care | Payer: 59 | Source: Ambulatory Visit | Attending: Family Medicine | Admitting: Family Medicine

## 2022-05-06 ENCOUNTER — Encounter: Payer: Self-pay | Admitting: Family Medicine

## 2022-05-06 VITALS — HR 83

## 2022-05-06 DIAGNOSIS — U071 COVID-19: Secondary | ICD-10-CM | POA: Diagnosis not present

## 2022-05-06 DIAGNOSIS — R051 Acute cough: Secondary | ICD-10-CM

## 2022-05-06 DIAGNOSIS — R0602 Shortness of breath: Secondary | ICD-10-CM | POA: Insufficient documentation

## 2022-05-06 DIAGNOSIS — R5383 Other fatigue: Secondary | ICD-10-CM | POA: Diagnosis not present

## 2022-05-06 MED ORDER — ALBUTEROL SULFATE HFA 108 (90 BASE) MCG/ACT IN AERS: 2.0000 | INHALATION_SPRAY | Freq: Four times a day (QID) | RESPIRATORY_TRACT | 0 refills | Status: DC | PRN

## 2022-05-06 MED ORDER — NIRMATRELVIR/RITONAVIR (PAXLOVID)TABLET: 3.0000 | ORAL_TABLET | Freq: Two times a day (BID) | ORAL | 0 refills | Status: AC

## 2022-05-06 MED ORDER — BENZONATATE 100 MG PO CAPS: 100.0000 mg | ORAL_CAPSULE | Freq: Three times a day (TID) | ORAL | 0 refills | Status: DC | PRN

## 2022-05-06 NOTE — Telephone Encounter (Signed)
     Chief Complaint: COVID positive Symptoms: Cough, SOB with exertion Frequency: Saturday Pertinent Negatives: Patient denies  Disposition: '[]'$ ED /'[]'$ Urgent Care (no appt availability in office) / '[x]'$ Appointment(In office/virtual)/ '[]'$  [facility]/ '[]'$ Home Care/ '[]'$ Refused Recommended Disposition /'[]'$ [facility]/ '[]'$  Follow-up with PCP Additional Notes:   Reason for Disposition  MILD difficulty breathing (e.g., minimal/no SOB at rest, SOB with walking, pulse <100)  Answer Assessment - Initial Assessment Questions 1. COVID-19 DIAGNOSIS: "How do you know that you have COVID?" (e.g., positive lab test or self-test, diagnosed by doctor or NP/PA, symptoms after exposure).     Home test 2. COVID-19 EXPOSURE: "Was there any known exposure to COVID before the symptoms began?" CDC Definition of close contact: within 6 feet (2 meters) for a total of 15 minutes or more over a 24-hour period.      No 3. ONSET: "When did the COVID-19 symptoms start?"      Friday 4. WORST SYMPTOM: "What is your worst symptom?" (e.g., cough, fever, shortness of breath, muscle aches)     SOB, COUGH 5. COUGH: "Do you have a cough?" If Yes, ask: "How bad is the cough?"       Yes 6. FEVER: "Do you have a fever?" If Yes, ask: "What is your temperature, how was it measured, and when did it start?"     Not today 7. RESPIRATORY STATUS: "Describe your breathing?" (e.g., normal; shortness of breath, wheezing, unable to speak)      SOB with exertion 8. BETTER-SAME-WORSE: "Are you getting better, staying the same or getting worse compared to yesterday?"  If getting worse, ask, "In what way?"     Same 9. OTHER SYMPTOMS: "Do you have any other symptoms?"  (e.g., chills, fatigue, headache, loss of smell or taste, muscle pain, sore throat)     Dry throat 10. HIGH RISK DISEASE: "Do you have any chronic medical problems?" (e.g., asthma, heart or lung disease, weak immune system, obesity, etc.)       No 11.  VACCINE: "Have you had the COVID-19 vaccine?" If Yes, ask: "Which one, how many shots, when did you get it?"       N/a 12. PREGNANCY: "Is there any chance you are pregnant?" "When was your last menstrual period?"       No 13. O2 SATURATION MONITOR:  "Do you use an oxygen saturation monitor (pulse oximeter) at home?" If Yes, ask "What is your reading (oxygen level) today?" "What is your usual oxygen saturation reading?" (e.g., 95%)       No  Protocols used: Coronavirus (COVID-19) Diagnosed or Suspected-A-AH

## 2022-05-06 NOTE — Progress Notes (Signed)
Name: Shelly Hansen   MRN: [id number]    DOB: 1991/06/09   Date:05/06/2022       Progress Note  Subjective  Chief Complaint  COVID Positive  I connected with  Shelly Hansen  on 05/06/22 at 10:20 AM EDT by a video enabled telemedicine application and verified that I am speaking with the correct person using two identifiers.  I discussed the limitations of evaluation and management by telemedicine and the availability of in person appointments. The patient expressed understanding and agreed to proceed with the virtual visit  Staff also discussed with the patient that there may be a patient responsible charge related to this service. Patient Location: at home  Provider Location: [facility] Additional Individuals present: alone   HPI  COVID-19: she developed symptoms on Friday , July 14 th, 2023. Initially had a dry throat, the following day she developed fatigue, it progressed with rhinorrhea, nasal congestion and now also has a cough. She did a home COVID-19 test at home on Saturday. She states past couple of days she was feeling fine, but she tried to do her weight training work out and developed SOB and chest tightness. She also noticed SOB  last night she felt SOB with mild activity   She lives with parents and two teenage cousins.   She has been staying in her room, but was called to go to work last night, but staying in her own hotel room.   Patient Active Problem List   Diagnosis Date Noted   Moderate episode of recurrent major depressive disorder ([place]) 05/02/2022   PTSD (post-traumatic stress disorder) 01/22/2022   Insomnia due to other mental disorder 01/22/2022   Attention deficit hyperactivity disorder (ADHD), predominantly inattentive type 01/22/2022   Anxiety 12/17/2020   Iron deficiency anemia 05/03/2018   Pap smear of cervix declined 04/28/2018   Well woman exam without gynecological exam 12/14/2015   Allergic rhinitis 12/14/2015    Past Surgical History:  Procedure  Laterality Date   MANDIBLE RECONSTRUCTION Right 2012    Family History  Problem Relation Age of Onset   Diabetes Father    Hypertension Mother    Healthy Brother    Dementia Maternal Grandmother    COPD Maternal Grandfather    Diabetes Paternal Grandfather     Social History   Socioeconomic History   Marital status: Single    Spouse name: Not on file   Number of children: 0   Years of education: Not on file   Highest education level: Associate degree: academic program  Occupational History   Occupation: Chief Technology Officer  Tobacco Use   Smoking status: Never   Smokeless tobacco: Never  Substance and Sexual Activity   Alcohol use: No    Alcohol/week: 0.0 standard drinks of alcohol   Drug use: No   Sexual activity: Never  Other Topics Concern   Not on file  Social History Narrative   Not on file   Social Determinants of Health   Financial Resource Strain: Medium Risk (02/12/2022)   Overall Financial Resource Strain (CARDIA)    Difficulty of Paying Living Expenses: Somewhat hard  Food Insecurity: No Food Insecurity (02/12/2022)   Hunger Vital Sign    Worried About Running Out of Food in the Last Year: Never true    [place] in the Last Year: Never true  Transportation Needs: No Transportation Needs (02/12/2022)   PRAPARE - Hydrologist (Medical): No    Lack of Transportation (  Non-Medical): No  Physical Activity: Sufficiently Active (02/12/2022)   Exercise Vital Sign    Days of Exercise per Week: 3 days    Minutes of Exercise per Session: 50 min  Stress: No Stress Concern Present (02/12/2022)   [place]    Feeling of Stress : Only a little  Social Connections: Moderately Integrated (02/12/2022)   Social Connection and Isolation Panel [NHANES]    Frequency of Communication with Friends and Family: More than three times a week    Frequency of Social Gatherings with  Friends and Family: More than three times a week    Attends Religious Services: More than 4 times per year    Active Member of [employer] or Organizations: Yes    Attends Archivist Meetings: More than 4 times per year    Marital Status: Never married  Intimate Partner Violence: Not At Risk (02/12/2022)   Humiliation, Afraid, Rape, and Kick questionnaire    Fear of Current or Ex-Partner: No    Emotionally Abused: No    Physically Abused: No    Sexually Abused: No     Current Outpatient Medications:    busPIRone (BUSPAR) 5 MG tablet, Take 1 tablet (5 mg total) by mouth 3 (three) times daily., Disp: 270 tablet, Rfl: 3   fluticasone (FLONASE) 50 MCG/ACT nasal spray, Place 2 sprays into both nostrils daily., Disp: 16 g, Rfl: 6   hydrOXYzine (ATARAX) 10 MG tablet, Take 1 tablet (10 mg total) by mouth at bedtime as needed., Disp: 90 tablet, Rfl: 3   Iron, Ferrous Sulfate, 325 (65 Fe) MG TABS, Take 325 mg by mouth daily., Disp: 90 tablet, Rfl: 1  No Known Allergies  I personally reviewed active problem list, medication list, allergies, family history, social history, health maintenance with the patient/caregiver today.   ROS  Ten systems reviewed and is negative except as mentioned in HPI   Objective  Virtual encounter, vitals not obtained.  There is no height or weight on file to calculate BMI.  Physical Exam  Awake, alert and oriented   PHQ2/9:    05/06/2022    9:22 AM 05/02/2022    2:52 PM 02/12/2022    2:31 PM 01/22/2022    1:35 PM 12/25/2021    9:49 AM  Depression screen PHQ 2/9  Decreased Interest '2 1 1 2 3  '$ Down, Depressed, Hopeless '2 2 1 2 3  '$ PHQ - 2 Score '4 3 2 4 6  '$ Altered sleeping 3 1 0 2 3  Tired, decreased energy '3 1 1 2 3  '$ Change in appetite 2 1 0 1 3  Feeling bad or failure about yourself  2 2 0 1 3  Trouble concentrating 1 3 0 3 3  Moving slowly or fidgety/restless 0 0 0 0 1  Suicidal thoughts 1 1 0 0 2  PHQ-9 Score '16 12 3 13 24  '$ Difficult doing  work/chores  Somewhat difficult Not difficult at all Somewhat difficult    PHQ-2/9 Result is positive.    Fall Risk:    05/06/2022    9:21 AM 05/02/2022    2:52 PM 02/12/2022    2:30 PM 01/22/2022    1:35 PM 12/25/2021    9:48 AM  Fall Risk   Falls in the past year? 0 0 0 0 0  Number falls in past yr: 0 0 0 0 0  Injury with Fall? 0 0 0 0 0  Risk for fall due  to : No Fall Risks      Follow up Falls prevention discussed Falls evaluation completed  Falls evaluation completed Falls evaluation completed     Assessment & Plan  1. COVID-19  - DG Chest 2 View; Future - nirmatrelvir/ritonavir EUA (PAXLOVID) 20 x 150 MG & 10 x '100MG'$  TABS; Take 3 tablets by mouth 2 (two) times daily for 5 days. (Take nirmatrelvir 150 mg two tablets twice daily for 5 days and ritonavir 100 mg one tablet twice daily for 5 days) Patient GFR is 109  Dispense: 30 tablet; Refill: 0  Discussed checking pulse ox , if below 90 % go to [facility] medication discussed including nasal saline.   2. SOB (shortness of breath) on exertion  - DG Chest 2 View; Future - albuterol (VENTOLIN HFA) 108 (90 Base) MCG/ACT inhaler; Inhale 2 puffs into the lungs every 6 (six) hours as needed for wheezing or shortness of breath.  Dispense: 8 g; Refill: 0  3. Acute cough  - benzonatate (TESSALON) 100 MG capsule; Take 1-2 capsules (100-200 mg total) by mouth 3 (three) times daily as needed.  Dispense: 40 capsule; Refill: 0   I discussed the assessment and treatment plan with the patient. The patient was provided an opportunity to ask questions and all were answered. The patient agreed with the plan and demonstrated an understanding of the instructions.  The patient was advised to call back or seek an in-person evaluation if the symptoms worsen or if the condition fails to improve as anticipated.  I provided 25 minutes of non-face-to-face time during this encounter.

## 2022-05-09 DIAGNOSIS — R69 Illness, unspecified: Secondary | ICD-10-CM | POA: Diagnosis not present

## 2022-05-23 DIAGNOSIS — R69 Illness, unspecified: Secondary | ICD-10-CM | POA: Diagnosis not present

## 2022-05-30 DIAGNOSIS — R69 Illness, unspecified: Secondary | ICD-10-CM | POA: Diagnosis not present

## 2022-06-02 ENCOUNTER — Other Ambulatory Visit: Payer: Self-pay | Admitting: Family Medicine

## 2022-06-02 DIAGNOSIS — R0602 Shortness of breath: Secondary | ICD-10-CM

## 2022-06-04 DIAGNOSIS — R69 Illness, unspecified: Secondary | ICD-10-CM | POA: Diagnosis not present

## 2022-06-13 DIAGNOSIS — R69 Illness, unspecified: Secondary | ICD-10-CM | POA: Diagnosis not present

## 2022-06-18 DIAGNOSIS — R69 Illness, unspecified: Secondary | ICD-10-CM | POA: Diagnosis not present

## 2022-06-27 DIAGNOSIS — R69 Illness, unspecified: Secondary | ICD-10-CM | POA: Diagnosis not present

## 2022-07-03 DIAGNOSIS — R69 Illness, unspecified: Secondary | ICD-10-CM | POA: Diagnosis not present

## 2022-10-20 DIAGNOSIS — Z419 Encounter for procedure for purposes other than remedying health state, unspecified: Secondary | ICD-10-CM | POA: Diagnosis not present

## 2022-11-03 ENCOUNTER — Ambulatory Visit: Payer: 59 | Admitting: Nurse Practitioner

## 2022-11-20 DIAGNOSIS — Z419 Encounter for procedure for purposes other than remedying health state, unspecified: Secondary | ICD-10-CM | POA: Diagnosis not present

## 2022-12-19 DIAGNOSIS — Z419 Encounter for procedure for purposes other than remedying health state, unspecified: Secondary | ICD-10-CM | POA: Diagnosis not present

## 2023-01-19 DIAGNOSIS — Z419 Encounter for procedure for purposes other than remedying health state, unspecified: Secondary | ICD-10-CM | POA: Diagnosis not present

## 2023-02-18 DIAGNOSIS — Z419 Encounter for procedure for purposes other than remedying health state, unspecified: Secondary | ICD-10-CM | POA: Diagnosis not present

## 2023-03-21 DIAGNOSIS — Z419 Encounter for procedure for purposes other than remedying health state, unspecified: Secondary | ICD-10-CM | POA: Diagnosis not present

## 2023-04-20 DIAGNOSIS — Z419 Encounter for procedure for purposes other than remedying health state, unspecified: Secondary | ICD-10-CM | POA: Diagnosis not present

## 2023-05-21 DIAGNOSIS — Z419 Encounter for procedure for purposes other than remedying health state, unspecified: Secondary | ICD-10-CM | POA: Diagnosis not present

## 2023-06-21 DIAGNOSIS — Z419 Encounter for procedure for purposes other than remedying health state, unspecified: Secondary | ICD-10-CM | POA: Diagnosis not present

## 2023-07-21 DIAGNOSIS — Z419 Encounter for procedure for purposes other than remedying health state, unspecified: Secondary | ICD-10-CM | POA: Diagnosis not present

## 2023-08-21 DIAGNOSIS — Z419 Encounter for procedure for purposes other than remedying health state, unspecified: Secondary | ICD-10-CM | POA: Diagnosis not present

## 2023-09-20 DIAGNOSIS — Z419 Encounter for procedure for purposes other than remedying health state, unspecified: Secondary | ICD-10-CM | POA: Diagnosis not present

## 2023-10-21 DIAGNOSIS — Z419 Encounter for procedure for purposes other than remedying health state, unspecified: Secondary | ICD-10-CM | POA: Diagnosis not present

## 2023-11-01 DIAGNOSIS — Z419 Encounter for procedure for purposes other than remedying health state, unspecified: Secondary | ICD-10-CM | POA: Diagnosis not present

## 2023-11-21 DIAGNOSIS — Z419 Encounter for procedure for purposes other than remedying health state, unspecified: Secondary | ICD-10-CM | POA: Diagnosis not present

## 2023-12-04 ENCOUNTER — Ambulatory Visit (INDEPENDENT_AMBULATORY_CARE_PROVIDER_SITE_OTHER): Payer: 59 | Admitting: Nurse Practitioner

## 2023-12-04 ENCOUNTER — Encounter: Payer: Self-pay | Admitting: Nurse Practitioner

## 2023-12-04 VITALS — BP 112/64 | HR 97 | Temp 98.3°F | Resp 16 | Ht 63.0 in | Wt 123.0 lb

## 2023-12-04 DIAGNOSIS — F4310 Post-traumatic stress disorder, unspecified: Secondary | ICD-10-CM | POA: Diagnosis not present

## 2023-12-04 DIAGNOSIS — F419 Anxiety disorder, unspecified: Secondary | ICD-10-CM

## 2023-12-04 DIAGNOSIS — Z131 Encounter for screening for diabetes mellitus: Secondary | ICD-10-CM

## 2023-12-04 DIAGNOSIS — F331 Major depressive disorder, recurrent, moderate: Secondary | ICD-10-CM

## 2023-12-04 DIAGNOSIS — F900 Attention-deficit hyperactivity disorder, predominantly inattentive type: Secondary | ICD-10-CM | POA: Diagnosis not present

## 2023-12-04 DIAGNOSIS — D509 Iron deficiency anemia, unspecified: Secondary | ICD-10-CM

## 2023-12-04 MED ORDER — HYDROXYZINE PAMOATE 25 MG PO CAPS
25.0000 mg | ORAL_CAPSULE | Freq: Three times a day (TID) | ORAL | 1 refills | Status: DC | PRN
Start: 2023-12-04 — End: 2024-02-02

## 2023-12-04 MED ORDER — SERTRALINE HCL 25 MG PO TABS: 25.0000 mg | ORAL_TABLET | Freq: Every day | ORAL | 0 refills | Status: DC

## 2023-12-04 NOTE — Progress Notes (Signed)
BP 112/64   Pulse 97   Temp 98.3 F (36.8 C)   Resp 16   Ht 5\' 3"  (1.6 m)   Wt 123 lb (55.8 kg)   LMP 11/25/2023   SpO2 99%   BMI 21.79 kg/m    Subjective:    Patient ID: Shelly Hansen, female    DOB: [birth year], 33 y.o.   MRN: [id number]  HPI: Shelly Hansen is a 33 y.o. female  Chief Complaint  Patient presents with   Medical Management of Chronic Issues   Medication Refill    Discussed the use of AI scribe software for clinical note transcription with the patient, who gave verbal consent to proceed.  History of Present Illness   The patient, with a history of anxiety and suspected ADHD, presents for a medication refill. She has been off her prescribed medications, hydroxyzine and Zoloft, for over a year due to a busy schedule and lack of follow-up with her psychiatrist. The patient reports that the Zoloft was helpful in managing her symptoms, but she stopped taking it thinking she was fine. She acknowledges that she should have been weaned off the medication under medical supervision.  The patient also reports difficulty focusing, which she believes may be associated with ADHD. She has not been on medication for ADHD, preferring to manage her symptoms through talk therapy. The patient reports that her mental health has been generally good, with the last major anxiety attack occurring in 2023. She has had two close calls since then, but has managed to cope without medication.  The patient also reports having heavy periods and a history of anemia. She has not been taking iron supplements, which were previously recommended to manage her anemia. The patient also mentions breast tenderness after her menstrual cycle, which has been ongoing for about a week and a half.       12/04/2023    9:12 AM 05/06/2022    9:22 AM 05/02/2022    2:52 PM  Depression screen PHQ 2/9  Decreased Interest 2 2 1   Down, Depressed, Hopeless 1 2 2   PHQ - 2 Score 3 4 3   Altered sleeping 3 3 1   Tired,  decreased energy 3 3 1   Change in appetite 2 2 1   Feeling bad or failure about yourself  1 2 2   Trouble concentrating 3 1 3   Moving slowly or fidgety/restless 1 0 0  Suicidal thoughts 0 1 1  PHQ-9 Score 16 16 12   Difficult doing work/chores Very difficult  Somewhat difficult       12/04/2023    9:22 AM 05/02/2022    2:54 PM 01/22/2022    1:36 PM 12/25/2021    9:52 AM  GAD 7 : Generalized Anxiety Score  Nervous, Anxious, on Edge 1 2 2 3   Control/stop worrying 1 2 2 3   Worry too much - different things 1 2 2 3   Trouble relaxing 2 2 1 3   Restless 1 2 1 3   Easily annoyed or irritable 1 2 1 3   Afraid - awful might happen 2 2 2 3   Total GAD 7 Score 9 14 11 21   Anxiety Difficulty Very difficult Somewhat difficult Somewhat difficult Somewhat difficult     Relevant past medical, surgical, family and social history reviewed and updated as indicated. Interim medical history since our last visit reviewed. Allergies and medications reviewed and updated.  Review of Systems  Constitutional: Negative for fever or weight change.  Respiratory: Negative for cough and shortness of  breath.   Cardiovascular: Negative for chest pain or palpitations.  Gastrointestinal: Negative for abdominal pain, no bowel changes.  Musculoskeletal: Negative for gait problem or joint swelling.  Skin: Negative for rash.  Neurological: Negative for dizziness or headache.  No other specific complaints in a complete review of systems (except as listed in HPI above).      Objective:    BP 112/64   Pulse 97   Temp 98.3 F (36.8 C)   Resp 16   Ht 5\' 3"  (1.6 m)   Wt 123 lb (55.8 kg)   LMP 11/25/2023   SpO2 99%   BMI 21.79 kg/m    Wt Readings from Last 3 Encounters:  12/04/23 123 lb (55.8 kg)  05/02/22 123 lb 3.2 oz (55.9 kg)  02/12/22 122 lb 9.6 oz (55.6 kg)    Physical Exam Vitals reviewed.  Constitutional:      Appearance: Normal appearance.  HENT:     Head: Normocephalic.  Cardiovascular:     Rate  and Rhythm: Normal rate and regular rhythm.  Pulmonary:     Effort: Pulmonary effort is normal.     Breath sounds: Normal breath sounds.  Musculoskeletal:        General: Normal range of motion.  Skin:    General: Skin is warm and dry.  Neurological:     General: No focal deficit present.     Mental Status: She is alert and oriented to person, place, and time. Mental status is at baseline.  Psychiatric:        Mood and Affect: Mood normal.        Behavior: Behavior normal.        Thought Content: Thought content normal.        Judgment: Judgment normal.     Results for orders placed or performed in visit on 02/12/22  CBC with Differential/Platelet   Collection Time: 02/12/22  3:18 PM  Result Value Ref Range   WBC 6.2 3.8 - 10.8 Thousand/uL   RBC 4.11 3.80 - 5.10 Million/uL   Hemoglobin 9.6 (L) 11.7 - 15.5 g/dL   HCT 04.5 (L) 40.9 - 81.1 %   MCV 76.4 (L) 80.0 - 100.0 fL   MCH 23.4 (L) 27.0 - 33.0 pg   MCHC 30.6 (L) 32.0 - 36.0 g/dL   RDW 91.4 (H) 78.2 - 95.6 %   Platelets 310 140 - 400 Thousand/uL   MPV 11.1 7.5 - 12.5 fL   Neutro Abs 2,722 1,500 - 7,800 cells/uL   Lymphs Abs 2,480 850 - 3,900 cells/uL   Absolute Monocytes 775 200 - 950 cells/uL   Eosinophils Absolute 174 15 - 500 cells/uL   Basophils Absolute 50 0 - 200 cells/uL   Neutrophils Relative % 43.9 %   Total Lymphocyte 40.0 %   Monocytes Relative 12.5 %   Eosinophils Relative 2.8 %   Basophils Relative 0.8 %  COMPLETE METABOLIC PANEL WITH GFR   Collection Time: 02/12/22  3:18 PM  Result Value Ref Range   Glucose, Bld 82 65 - 99 mg/dL   BUN 13 7 - 25 mg/dL   Creat 2.13 0.86 - 5.78 mg/dL   eGFR 469 > OR = 60 GE/XBM/8.41L2   BUN/Creatinine Ratio NOT APPLICABLE 6 - 22 (calc)   Sodium 140 135 - 146 mmol/L   Potassium 4.5 3.5 - 5.3 mmol/L   Chloride 107 98 - 110 mmol/L   CO2 25 20 - 32 mmol/L   Calcium 9.2 8.6 - 10.2 mg/dL  Total Protein 7.0 6.1 - 8.1 g/dL   Albumin 4.4 3.6 - 5.1 g/dL   Globulin 2.6 1.9  - 3.7 g/dL (calc)   AG Ratio 1.7 1.0 - 2.5 (calc)   Total Bilirubin 1.1 0.2 - 1.2 mg/dL   Alkaline phosphatase (APISO) 36 31 - 125 U/L   AST 19 10 - 30 U/L   ALT 8 6 - 29 U/L  Iron, TIBC and Ferritin Panel   Collection Time: 02/12/22  3:18 PM  Result Value Ref Range   Iron 55 40 - 190 mcg/dL   TIBC 161 096 - 045 mcg/dL (calc)   %SAT 13 (L) 16 - 45 % (calc)   Ferritin 5 (L) 16 - 154 ng/mL       Assessment & Plan:   Problem List Items Addressed This Visit       Other   Iron deficiency anemia   Relevant Orders   CBC with Differential/Platelet   Iron, TIBC and Ferritin Panel   Anxiety   Relevant Medications   hydrOXYzine (VISTARIL) 25 MG capsule   sertraline (ZOLOFT) 25 MG tablet   PTSD (post-traumatic stress disorder)   Relevant Medications   hydrOXYzine (VISTARIL) 25 MG capsule   sertraline (ZOLOFT) 25 MG tablet   Attention deficit hyperactivity disorder (ADHD), predominantly inattentive type   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 Reports benefit from Zoloft in the past. Recent episodes of anxiety but able to manage with coping mechanisms. Difficulty focusing.   - Restart Zoloft at lowest dose and titrate up as needed.   - Refill Hydroxyzine 25mg  as needed.   - Follow up in 4 weeks to assess response to Zoloft.    Iron Deficiency Anemia   History of anemia, not currently on iron supplementation. Reports heavy menstrual cycle in November and December.   - Check hemoglobin and hematocrit levels today.   - If levels are low, restart iron supplementation.   - Consider birth control for heavy menstrual cycles if she  persists.   - If iron supplementation is ineffective, consider referral to hematology for iron infusions.    Breast Tenderness   Reports breast tenderness post-menstrual cycle. No family history of breast cancer. No nipple discharge or skin changes.   - Normal hormonal changes post-menstrual cycle.    General Health Maintenance   - Encourage consistent therapy sessions for mental health support.   - Follow up after lab results are available.        Follow up plan: Return in about 4 weeks (around 01/01/2024) for follow up.

## 2023-12-05 LAB — COMPLETE METABOLIC PANEL WITH GFR
AG Ratio: 1.6 (calc) (ref 1.0–2.5)
ALT: 8 U/L (ref 6–29)
AST: 16 U/L (ref 10–30)
Albumin: 4.2 g/dL (ref 3.6–5.1)
Alkaline phosphatase (APISO): 34 U/L (ref 31–125)
BUN: 7 mg/dL (ref 7–25)
CO2: 26 mmol/L (ref 20–32)
Calcium: 9 mg/dL (ref 8.6–10.2)
Chloride: 107 mmol/L (ref 98–110)
Creat: 0.68 mg/dL (ref 0.50–0.97)
Globulin: 2.7 g/dL (ref 1.9–3.7)
Glucose, Bld: 97 mg/dL (ref 65–99)
Potassium: 4.1 mmol/L (ref 3.5–5.3)
Sodium: 141 mmol/L (ref 135–146)
Total Bilirubin: 0.8 mg/dL (ref 0.2–1.2)
Total Protein: 6.9 g/dL (ref 6.1–8.1)
eGFR: 120 mL/min/{1.73_m2} (ref >=60)

## 2023-12-05 LAB — HEMOGLOBIN A1C
Hgb A1c MFr Bld: 5.6 %{Hb} (ref <5.7)
Mean Plasma Glucose: 114 mg/dL
eAG (mmol/L): 6.3 mmol/L

## 2023-12-05 LAB — CBC WITH DIFFERENTIAL/PLATELET
Absolute Lymphocytes: 2044 {cells}/uL (ref 850–3900)
Absolute Monocytes: 525 {cells}/uL (ref 200–950)
Basophils Absolute: 42 {cells}/uL (ref 0–200)
Basophils Relative: 0.8 %
Eosinophils Absolute: 52 {cells}/uL (ref 15–500)
Eosinophils Relative: 1 %
HCT: 32.6 % — ABNORMAL LOW (ref 35.0–45.0)
Hemoglobin: 10.1 g/dL — ABNORMAL LOW (ref 11.7–15.5)
MCH: 24.4 pg — ABNORMAL LOW (ref 27.0–33.0)
MCHC: 31 g/dL — ABNORMAL LOW (ref 32.0–36.0)
MCV: 78.7 fL — ABNORMAL LOW (ref 80.0–100.0)
MPV: 11.1 fL (ref 7.5–12.5)
Monocytes Relative: 10.1 %
Neutro Abs: 2538 {cells}/uL (ref 1500–7800)
Neutrophils Relative %: 48.8 %
Platelets: 323 10*3/uL (ref 140–400)
RBC: 4.14 10*6/uL (ref 3.80–5.10)
RDW: 16.1 % — ABNORMAL HIGH (ref 11.0–15.0)
Total Lymphocyte: 39.3 %
WBC: 5.2 10*3/uL (ref 3.8–10.8)

## 2023-12-05 LAB — IRON,TIBC AND FERRITIN PANEL
%SAT: 7 % — ABNORMAL LOW (ref 16–45)
Ferritin: 4 ng/mL — ABNORMAL LOW (ref 16–154)
Iron: 25 ug/dL — ABNORMAL LOW (ref 40–190)
TIBC: 383 ug/dL (ref 250–450)

## 2023-12-07 ENCOUNTER — Encounter: Payer: Self-pay | Admitting: Nurse Practitioner

## 2023-12-19 DIAGNOSIS — Z419 Encounter for procedure for purposes other than remedying health state, unspecified: Secondary | ICD-10-CM | POA: Diagnosis not present

## 2023-12-26 ENCOUNTER — Other Ambulatory Visit: Payer: Self-pay | Admitting: Nurse Practitioner

## 2023-12-26 DIAGNOSIS — F4310 Post-traumatic stress disorder, unspecified: Secondary | ICD-10-CM

## 2023-12-26 DIAGNOSIS — F331 Major depressive disorder, recurrent, moderate: Secondary | ICD-10-CM

## 2023-12-26 DIAGNOSIS — F419 Anxiety disorder, unspecified: Secondary | ICD-10-CM

## 2023-12-28 NOTE — Telephone Encounter (Signed)
 Requested medication (s) are due for refill today: Yes  Requested medication (s) are on the active medication list: Yes  Last refill:  2.14.25  Future visit scheduled: Yes  Notes to clinic:  See pharmacy request.    Requested Prescriptions  Pending Prescriptions Disp Refills   sertraline (ZOLOFT) 25 MG tablet [Pharmacy Med Name: SERTRALINE HCL 25 MG TABLET] 90 tablet 1    Sig: Take 1 tablet (25 mg total) by mouth daily.     Psychiatry:  Antidepressants - SSRI - sertraline Failed - 12/28/2023  2:18 PM      Failed - Valid encounter within last 6 months    Recent Outpatient Visits           1 year ago COVID-19   [facility] Alba Cory, MD   1 year ago PTSD (post-traumatic stress disorder)   [facility] Della Goo F, FNP   1 year ago Physical exam, annual   [facility] Berniece Salines, FNP   1 year ago Anxiety   [facility] Berniece Salines, FNP   2 years ago Anxiety   [facility] Berniece Salines, FNP       Future Appointments             In 4 days Berniece Salines, FNP [facility]            Passed - AST in normal range and within 360 days    AST  Date Value Ref Range Status  12/04/2023 16 10 - 30 U/L Final         Passed - ALT in normal range and within 360 days    ALT  Date Value Ref Range Status  12/04/2023 8 6 - 29 U/L Final         Passed - Completed PHQ-2 or PHQ-9 in the last 360 days       hydrOXYzine (VISTARIL) 25 MG capsule [Pharmacy Med Name: HYDROXYZINE PAM 25 MG CAP] 270 capsule 1    Sig: Take 1 capsule (25 mg total) by mouth every 8 (eight) hours as needed.     Ear, Nose, and Throat:  Antihistamines 2 Failed - 12/28/2023  2:18 PM      Failed - Valid encounter within last 12 months    Recent Outpatient Visits           1 year ago COVID-19   [facility] Alba Cory, MD   1 year ago PTSD (post-traumatic stress disorder)   [facility] Berniece Salines, FNP   1 year ago Physical exam, annual   [facility] Berniece Salines, FNP   1 year ago Anxiety   [facility] Berniece Salines, FNP   2 years ago Anxiety   [facility] Berniece Salines, FNP       Future Appointments             In 4 days Berniece Salines, FNP [facility]            Passed - Cr in normal range and within 360 days    Creat  Date Value Ref Range Status  12/04/2023 0.68 0.50 - 0.97 mg/dL Final

## 2023-12-31 NOTE — Progress Notes (Unsigned)
 LMP 11/25/2023    Subjective:    Patient ID: Shelly Hansen, female    DOB: [birth year], 33 y.o.   MRN: [id number]  HPI: Shelly Hansen is a 33 y.o. female  No chief complaint on file.   Discussed the use of AI scribe software for clinical note transcription with the patient, who gave verbal consent to proceed.  History of Present Illness           12/04/2023    9:12 AM 05/06/2022    9:22 AM 05/02/2022    2:52 PM  Depression screen PHQ 2/9  Decreased Interest 2 2 1   Down, Depressed, Hopeless 1 2 2   PHQ - 2 Score 3 4 3   Altered sleeping 3 3 1   Tired, decreased energy 3 3 1   Change in appetite 2 2 1   Feeling bad or failure about yourself  1 2 2   Trouble concentrating 3 1 3   Moving slowly or fidgety/restless 1 0 0  Suicidal thoughts 0 1 1  PHQ-9 Score 16 16 12   Difficult doing work/chores Very difficult  Somewhat difficult    Relevant past medical, surgical, family and social history reviewed and updated as indicated. Interim medical history since our last visit reviewed. Allergies and medications reviewed and updated.  Review of Systems  Per HPI unless specifically indicated above     Objective:    LMP 11/25/2023   {Vitals History (Optional):23777} Wt Readings from Last 3 Encounters:  12/04/23 123 lb (55.8 kg)  05/02/22 123 lb 3.2 oz (55.9 kg)  02/12/22 122 lb 9.6 oz (55.6 kg)    Physical Exam  Results for orders placed or performed in visit on 12/04/23  CBC with Differential/Platelet   Collection Time: 12/04/23  9:46 AM  Result Value Ref Range   WBC 5.2 3.8 - 10.8 Thousand/uL   RBC 4.14 3.80 - 5.10 Million/uL   Hemoglobin 10.1 (L) 11.7 - 15.5 g/dL   HCT 40.9 (L) 81.1 - 91.4 %   MCV 78.7 (L) 80.0 - 100.0 fL   MCH 24.4 (L) 27.0 - 33.0 pg   MCHC 31.0 (L) 32.0 - 36.0 g/dL   RDW 78.2 (H) 95.6 - 21.3 %   Platelets 323 140 - 400 Thousand/uL   MPV 11.1 7.5 - 12.5 fL   Neutro Abs 2,538 1,500 - 7,800 cells/uL   Absolute Lymphocytes 2,044 850 - 3,900 cells/uL    Absolute Monocytes 525 200 - 950 cells/uL   Eosinophils Absolute 52 15 - 500 cells/uL   Basophils Absolute 42 0 - 200 cells/uL   Neutrophils Relative % 48.8 %   Total Lymphocyte 39.3 %   Monocytes Relative 10.1 %   Eosinophils Relative 1.0 %   Basophils Relative 0.8 %  COMPLETE METABOLIC PANEL WITH GFR   Collection Time: 12/04/23  9:46 AM  Result Value Ref Range   Glucose, Bld 97 65 - 99 mg/dL   BUN 7 7 - 25 mg/dL   Creat 0.86 5.78 - 4.69 mg/dL   eGFR 629 > OR = 60 BM/WUX/3.24M0   BUN/Creatinine Ratio SEE NOTE: 6 - 22 (calc)   Sodium 141 135 - 146 mmol/L   Potassium 4.1 3.5 - 5.3 mmol/L   Chloride 107 98 - 110 mmol/L   CO2 26 20 - 32 mmol/L   Calcium 9.0 8.6 - 10.2 mg/dL   Total Protein 6.9 6.1 - 8.1 g/dL   Albumin 4.2 3.6 - 5.1 g/dL   Globulin 2.7 1.9 - 3.7 g/dL (calc)   AG  Ratio 1.6 1.0 - 2.5 (calc)   Total Bilirubin 0.8 0.2 - 1.2 mg/dL   Alkaline phosphatase (APISO) 34 31 - 125 U/L   AST 16 10 - 30 U/L   ALT 8 6 - 29 U/L  Iron, TIBC and Ferritin Panel   Collection Time: 12/04/23  9:46 AM  Result Value Ref Range   Iron 25 (L) 40 - 190 mcg/dL   TIBC 161 096 - 045 mcg/dL (calc)   %SAT 7 (L) 16 - 45 % (calc)   Ferritin 4 (L) 16 - 154 ng/mL  Hemoglobin A1c   Collection Time: 12/04/23  9:46 AM  Result Value Ref Range   Hgb A1c MFr Bld 5.6 <5.7 % of total Hgb   Mean Plasma Glucose 114 mg/dL   eAG (mmol/L) 6.3 mmol/L   {Labs (Optional):23779}    Assessment & Plan:   Problem List Items Addressed This Visit   None    Assessment and Plan             Follow up plan: No follow-ups on file.

## 2024-01-01 ENCOUNTER — Encounter: Payer: Self-pay | Admitting: Nurse Practitioner

## 2024-01-01 ENCOUNTER — Ambulatory Visit (INDEPENDENT_AMBULATORY_CARE_PROVIDER_SITE_OTHER): Payer: 59 | Admitting: Nurse Practitioner

## 2024-01-01 VITALS — BP 116/74 | HR 80 | Temp 98.0°F | Resp 18 | Ht 63.0 in | Wt 118.4 lb

## 2024-01-01 DIAGNOSIS — F4310 Post-traumatic stress disorder, unspecified: Secondary | ICD-10-CM | POA: Diagnosis not present

## 2024-01-01 DIAGNOSIS — F331 Major depressive disorder, recurrent, moderate: Secondary | ICD-10-CM | POA: Diagnosis not present

## 2024-01-01 DIAGNOSIS — F419 Anxiety disorder, unspecified: Secondary | ICD-10-CM | POA: Diagnosis not present

## 2024-01-01 MED ORDER — SERTRALINE HCL 25 MG PO TABS: 25.0000 mg | ORAL_TABLET | Freq: Every day | ORAL | 0 refills | Status: DC

## 2024-01-30 DIAGNOSIS — Z419 Encounter for procedure for purposes other than remedying health state, unspecified: Secondary | ICD-10-CM | POA: Diagnosis not present

## 2024-01-31 ENCOUNTER — Other Ambulatory Visit: Payer: Self-pay | Admitting: Nurse Practitioner

## 2024-01-31 DIAGNOSIS — F4310 Post-traumatic stress disorder, unspecified: Secondary | ICD-10-CM

## 2024-01-31 DIAGNOSIS — F331 Major depressive disorder, recurrent, moderate: Secondary | ICD-10-CM

## 2024-01-31 DIAGNOSIS — F419 Anxiety disorder, unspecified: Secondary | ICD-10-CM

## 2024-02-02 NOTE — Telephone Encounter (Signed)
 Requested Prescriptions  Pending Prescriptions Disp Refills   hydrOXYzine (VISTARIL) 25 MG capsule [Pharmacy Med Name: HYDROXYZINE PAM 25 MG CAP] 90 capsule 1    Sig: TAKE 1 CAPSULE (25 MG TOTAL) BY MOUTH EVERY 8 (EIGHT) HOURS AS NEEDED.     Ear, Nose, and Throat:  Antihistamines 2 Passed - 02/02/2024  8:50 AM      Passed - Cr in normal range and within 360 days    Creat  Date Value Ref Range Status  12/04/2023 0.68 0.50 - 0.97 mg/dL Final         Passed - Valid encounter within last 12 months    Recent Outpatient Visits           1 month ago Anxiety   [facility] Donny Gall F, FNP   2 months ago Moderate episode of recurrent major depressive disorder [facility])   [facility] Quinton Buckler, FNP       Future Appointments             In 2 months Abram Hoguet, Monalisa Angles, FNP [facility], [facility]

## 2024-02-29 DIAGNOSIS — Z419 Encounter for procedure for purposes other than remedying health state, unspecified: Secondary | ICD-10-CM | POA: Diagnosis not present

## 2024-03-26 ENCOUNTER — Other Ambulatory Visit: Payer: Self-pay | Admitting: Nurse Practitioner

## 2024-03-26 DIAGNOSIS — F419 Anxiety disorder, unspecified: Secondary | ICD-10-CM

## 2024-03-26 DIAGNOSIS — F331 Major depressive disorder, recurrent, moderate: Secondary | ICD-10-CM

## 2024-03-26 DIAGNOSIS — F4310 Post-traumatic stress disorder, unspecified: Secondary | ICD-10-CM

## 2024-03-28 NOTE — Telephone Encounter (Signed)
 Requested medication (s) are due for refill today: Yes         Requested medication (s) are on the active medication list: Yes  Last refill:  01/01/24  Future visit scheduled: Yes  Notes to clinic:  See pharmacy request.    Requested Prescriptions  Pending Prescriptions Disp Refills   sertraline  (ZOLOFT ) 25 MG tablet [Pharmacy Med Name: SERTRALINE  HCL 25 MG TABLET] 90 tablet 1    Sig: Take 1 tablet (25 mg total) by mouth daily.     Psychiatry:  Antidepressants - SSRI - sertraline  Passed - 03/28/2024  3:34 PM      Passed - AST in normal range and within 360 days    AST  Date Value Ref Range Status  12/04/2023 16 10 - 30 U/L Final         Passed - ALT in normal range and within 360 days    ALT  Date Value Ref Range Status  12/04/2023 8 6 - 29 U/L Final         Passed - Completed PHQ-2 or PHQ-9 in the last 360 days      Passed - Valid encounter within last 6 months    Recent Outpatient Visits           2 months ago Anxiety   [facility] Donny Gall F, FNP   3 months ago Moderate episode of recurrent major depressive disorder [facility])   [facility] Quinton Buckler, FNP       Future Appointments             In 2 weeks Abram Hoguet, Monalisa Angles, FNP [facility]

## 2024-03-31 DIAGNOSIS — Z419 Encounter for procedure for purposes other than remedying health state, unspecified: Secondary | ICD-10-CM | POA: Diagnosis not present

## 2024-04-12 ENCOUNTER — Encounter: Admitting: Nurse Practitioner

## 2024-04-28 ENCOUNTER — Other Ambulatory Visit: Payer: Self-pay | Admitting: Nurse Practitioner

## 2024-04-28 DIAGNOSIS — F419 Anxiety disorder, unspecified: Secondary | ICD-10-CM

## 2024-04-28 DIAGNOSIS — F4310 Post-traumatic stress disorder, unspecified: Secondary | ICD-10-CM

## 2024-04-28 DIAGNOSIS — F331 Major depressive disorder, recurrent, moderate: Secondary | ICD-10-CM

## 2024-04-28 NOTE — Telephone Encounter (Signed)
 Copied from CRM [id number]. Topic: Clinical - Medication Refill >> Apr 28, 2024  1:06 PM Nathanel BROCKS wrote: Medication: sertraline  (ZOLOFT ) 25 MG tablet  Has the patient contacted their pharmacy? Yes  This is the patient's preferred pharmacy:   [id number] - [place], [place] - [address] [place] [address] Phone: [phone] Fax: [phone]  Is this the correct pharmacy for this prescription? Yes If no, delete pharmacy and type the correct one.   Has the prescription been filled recently? Yes  Is the patient out of the medication? Yes  Has the patient been seen for an appointment in the last year OR does the patient have an upcoming appointment? No  Can we respond through MyChart? Yes  Agent: Please be advised that Rx refills may take up to 3 business days. We ask that you follow-up with your pharmacy.

## 2024-04-29 MED ORDER — SERTRALINE HCL 25 MG PO TABS: 25.0000 mg | ORAL_TABLET | Freq: Every day | ORAL | 0 refills | Status: DC

## 2024-04-29 NOTE — Telephone Encounter (Signed)
 Requested Prescriptions  Pending Prescriptions Disp Refills   sertraline  (ZOLOFT ) 25 MG tablet 90 tablet 0    Sig: Take 1 tablet (25 mg total) by mouth daily.     Psychiatry:  Antidepressants - SSRI - sertraline  Passed - 04/29/2024  4:15 PM      Passed - AST in normal range and within 360 days    AST  Date Value Ref Range Status  12/04/2023 16 10 - 30 U/L Final         Passed - ALT in normal range and within 360 days    ALT  Date Value Ref Range Status  12/04/2023 8 6 - 29 U/L Final         Passed - Completed PHQ-2 or PHQ-9 in the last 360 days      Passed - Valid encounter within last 6 months    Recent Outpatient Visits           3 months ago Anxiety   [facility] Gareth Clarity F, FNP   4 months ago Moderate episode of recurrent major depressive disorder [facility])   [facility] Gareth Clarity FALCON, [place]

## 2024-04-30 DIAGNOSIS — Z419 Encounter for procedure for purposes other than remedying health state, unspecified: Secondary | ICD-10-CM | POA: Diagnosis not present

## 2024-05-31 DIAGNOSIS — Z419 Encounter for procedure for purposes other than remedying health state, unspecified: Secondary | ICD-10-CM | POA: Diagnosis not present

## 2024-07-01 DIAGNOSIS — Z419 Encounter for procedure for purposes other than remedying health state, unspecified: Secondary | ICD-10-CM | POA: Diagnosis not present

## 2024-09-19 DIAGNOSIS — S3992XA Unspecified injury of lower back, initial encounter: Secondary | ICD-10-CM | POA: Diagnosis not present

## 2024-09-19 DIAGNOSIS — M546 Pain in thoracic spine: Secondary | ICD-10-CM | POA: Diagnosis not present

## 2024-09-19 DIAGNOSIS — S299XXA Unspecified injury of thorax, initial encounter: Secondary | ICD-10-CM | POA: Diagnosis not present

## 2024-09-19 DIAGNOSIS — M545 Low back pain, unspecified: Secondary | ICD-10-CM | POA: Diagnosis not present

## 2024-09-20 ENCOUNTER — Ambulatory Visit: Payer: Self-pay
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
## 2024-09-21 ENCOUNTER — Ambulatory Visit: Admitting: Nurse Practitioner

## 2024-09-21 ENCOUNTER — Encounter: Payer: Self-pay | Admitting: Nurse Practitioner

## 2024-09-21 VITALS — BP 122/86 | HR 80 | Temp 98.0°F | Ht 63.0 in | Wt 128.0 lb

## 2024-09-21 DIAGNOSIS — M546 Pain in thoracic spine: Secondary | ICD-10-CM | POA: Diagnosis not present

## 2024-09-21 DIAGNOSIS — F331 Major depressive disorder, recurrent, moderate: Secondary | ICD-10-CM | POA: Diagnosis not present

## 2024-09-21 DIAGNOSIS — F419 Anxiety disorder, unspecified: Secondary | ICD-10-CM | POA: Diagnosis not present

## 2024-09-21 DIAGNOSIS — F4310 Post-traumatic stress disorder, unspecified: Secondary | ICD-10-CM | POA: Diagnosis not present

## 2024-09-21 MED ORDER — HYDROXYZINE PAMOATE 25 MG PO CAPS: 25.0000 mg | ORAL_CAPSULE | Freq: Three times a day (TID) | ORAL | 1 refills | Status: AC | PRN

## 2024-09-21 MED ORDER — NAPROXEN 500 MG PO TABS: 500.0000 mg | ORAL_TABLET | Freq: Two times a day (BID) | ORAL | 0 refills | Status: AC

## 2024-09-21 MED ORDER — SERTRALINE HCL 25 MG PO TABS: 25.0000 mg | ORAL_TABLET | Freq: Every day | ORAL | 1 refills | Status: AC
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
## 2024-09-30 DIAGNOSIS — Z419 Encounter for procedure for purposes other than remedying health state, unspecified: Secondary | ICD-10-CM | POA: Diagnosis not present

## 2024-10-14 ENCOUNTER — Other Ambulatory Visit: Payer: Self-pay | Admitting: Nurse Practitioner

## 2024-10-14 DIAGNOSIS — F4310 Post-traumatic stress disorder, unspecified: Secondary | ICD-10-CM

## 2024-10-14 DIAGNOSIS — F419 Anxiety disorder, unspecified: Secondary | ICD-10-CM

## 2024-10-14 DIAGNOSIS — F331 Major depressive disorder, recurrent, moderate: Secondary | ICD-10-CM

## 2024-10-17 NOTE — Telephone Encounter (Signed)
 Requested Prescriptions  Refused Prescriptions Disp Refills   hydrOXYzine  (VISTARIL ) 25 MG capsule [Pharmacy Med Name: HYDROXYZINE  PAM 25 MG CAP] 270 capsule 1    Sig: TAKE 1 CAPSULE (25 MG TOTAL) BY MOUTH EVERY 8 (EIGHT) HOURS AS NEEDED.     Ear, Nose, and Throat:  Antihistamines 2 Passed - 10/17/2024  1:36 PM      Passed - Cr in normal range and within 360 days    Creat  Date Value Ref Range Status  12/04/2023 0.68 0.50 - 0.97 mg/dL Final         Passed - Valid encounter within last 12 months    Recent Outpatient Visits           3 weeks ago Acute midline thoracic back pain   [facility] Gareth Mliss FALCON, FNP   9 months ago Anxiety   [facility] Gareth Mliss F, FNP   10 months ago Moderate episode of recurrent major depressive disorder [facility])   [facility] Gareth Mliss FALCON, [place]

## 2024-10-26 ENCOUNTER — Telehealth: Payer: Self-pay
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
## 2024-10-26 NOTE — Telephone Encounter (Signed)
 Pt would like a call about a work note.

## 2024-11-03 ENCOUNTER — Ambulatory Visit: Payer: Self-pay
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
## 2024-11-08 ENCOUNTER — Ambulatory Visit: Admitting: Nurse Practitioner

## 2024-11-09 ENCOUNTER — Inpatient Hospital Stay: Admitting: Nurse Practitioner

## 2024-11-09 ENCOUNTER — Ambulatory Visit: Payer: Self-pay
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
## 2024-11-11 ENCOUNTER — Encounter: Payer: Self-pay | Admitting: Nurse Practitioner

## 2024-11-11 ENCOUNTER — Ambulatory Visit: Admitting: Nurse Practitioner

## 2024-11-11 VITALS — BP 112/78 | HR 93 | Temp 98.0°F | Ht 63.0 in | Wt 121.0 lb

## 2024-11-11 DIAGNOSIS — M5020 Other cervical disc displacement, unspecified cervical region: Secondary | ICD-10-CM | POA: Diagnosis not present
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
